# Patient Record
Sex: Female | Born: 1955 | Race: White | Hispanic: No | Marital: Single | Smoking: Former smoker
Health system: Southern US, Community
[De-identification: ages and names within clinical notes are randomized; demographics above are authoritative.]

## PROBLEM LIST (undated history)

## (undated) DIAGNOSIS — I8390 Asymptomatic varicose veins of unspecified lower extremity: Secondary | ICD-10-CM

## (undated) DIAGNOSIS — Z973 Presence of spectacles and contact lenses: Secondary | ICD-10-CM

## (undated) DIAGNOSIS — I10 Essential (primary) hypertension: Secondary | ICD-10-CM

## (undated) DIAGNOSIS — E785 Hyperlipidemia, unspecified: Secondary | ICD-10-CM

## (undated) DIAGNOSIS — R7303 Prediabetes: Secondary | ICD-10-CM

## (undated) DIAGNOSIS — Z780 Asymptomatic menopausal state: Secondary | ICD-10-CM

## (undated) DIAGNOSIS — K219 Gastro-esophageal reflux disease without esophagitis: Secondary | ICD-10-CM

## (undated) DIAGNOSIS — K5909 Other constipation: Secondary | ICD-10-CM

## (undated) HISTORY — DX: Gastro-esophageal reflux disease without esophagitis: K21.9

## (undated) HISTORY — DX: Asymptomatic menopausal state: Z78.0

## (undated) HISTORY — PX: DOBUTAMINE STRESS ECHO: SHX5426

## (undated) HISTORY — DX: Asymptomatic varicose veins of unspecified lower extremity: I83.90

## (undated) HISTORY — PX: OTHER SURGICAL HISTORY: SHX169

---

## 1981-06-24 HISTORY — PX: DILATION AND CURETTAGE OF UTERUS: SHX78

## 1998-06-28 ENCOUNTER — Other Ambulatory Visit: Admission: RE | Admit: 1998-06-28 | Discharge: 1998-06-28 | Payer: Self-pay | Admitting: Surgery

## 2000-06-06 ENCOUNTER — Other Ambulatory Visit: Admission: RE | Admit: 2000-06-06 | Discharge: 2000-06-06 | Payer: Self-pay | Admitting: Obstetrics and Gynecology

## 2000-11-20 ENCOUNTER — Emergency Department (HOSPITAL_COMMUNITY): Admission: EM | Admit: 2000-11-20 | Discharge: 2000-11-20 | Payer: Self-pay | Admitting: Emergency Medicine

## 2000-11-20 ENCOUNTER — Encounter: Payer: Self-pay | Admitting: Emergency Medicine

## 2001-07-15 ENCOUNTER — Other Ambulatory Visit: Admission: RE | Admit: 2001-07-15 | Discharge: 2001-07-15 | Payer: Self-pay | Admitting: Obstetrics and Gynecology

## 2002-07-16 ENCOUNTER — Other Ambulatory Visit: Admission: RE | Admit: 2002-07-16 | Discharge: 2002-07-16 | Payer: Self-pay | Admitting: Obstetrics and Gynecology

## 2003-07-08 ENCOUNTER — Ambulatory Visit (HOSPITAL_COMMUNITY): Admission: RE | Admit: 2003-07-08 | Discharge: 2003-07-08 | Payer: Self-pay | Admitting: Gastroenterology

## 2003-07-26 ENCOUNTER — Other Ambulatory Visit: Admission: RE | Admit: 2003-07-26 | Discharge: 2003-07-26 | Payer: Self-pay | Admitting: Obstetrics and Gynecology

## 2004-08-27 ENCOUNTER — Other Ambulatory Visit: Admission: RE | Admit: 2004-08-27 | Discharge: 2004-08-27 | Payer: Self-pay | Admitting: Obstetrics and Gynecology

## 2004-11-23 ENCOUNTER — Ambulatory Visit (HOSPITAL_COMMUNITY): Admission: RE | Admit: 2004-11-23 | Discharge: 2004-11-23 | Payer: Self-pay | Admitting: General Surgery

## 2005-08-08 ENCOUNTER — Encounter: Admission: RE | Admit: 2005-08-08 | Discharge: 2005-08-08 | Payer: Self-pay | Admitting: Family Medicine

## 2005-09-23 ENCOUNTER — Other Ambulatory Visit: Admission: RE | Admit: 2005-09-23 | Discharge: 2005-09-23 | Payer: Self-pay | Admitting: Obstetrics and Gynecology

## 2006-09-25 ENCOUNTER — Other Ambulatory Visit: Admission: RE | Admit: 2006-09-25 | Discharge: 2006-09-25 | Payer: Self-pay | Admitting: Obstetrics & Gynecology

## 2007-01-01 IMAGING — CT CT CHEST W/O CM
2 of 3 series · 16 of 36 positions shown, 20 images · IV contrast (agent unspecified)
Comparison: None.

CLINICAL DATA: Chronic cough.  Smoker.  Family history of cancer.  
 CHEST CT WITHOUT CONTRAST:
TECHNIQUE: Multidetector CT imaging of the chest was performed following the standard protocol without IV contrast.

[Series 2: routine chest · axial · 0.70mm/px · z∈[-365,-75]mm · 13 of 66 slices shown, 17 images]
[im 5/66  mediastinal]
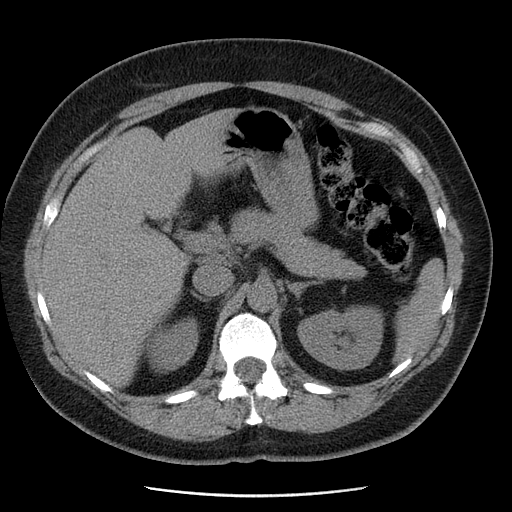
[im 5/66  lung]
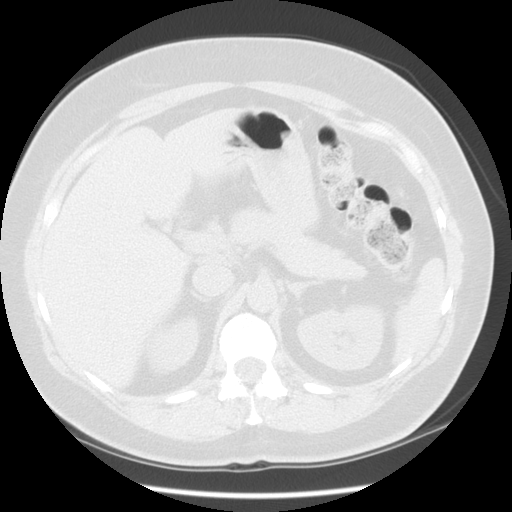
[im 10/66  lung]
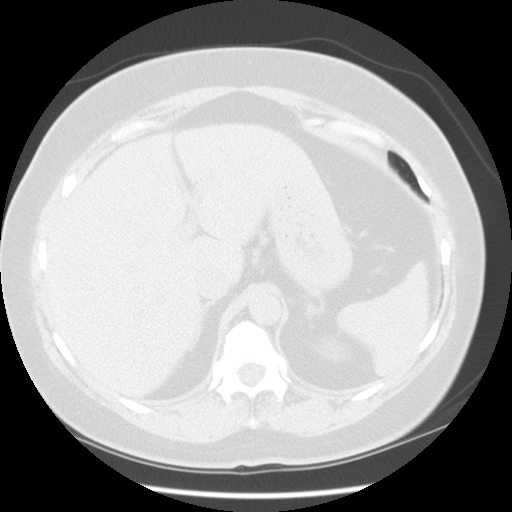
[im 15/66  lung]
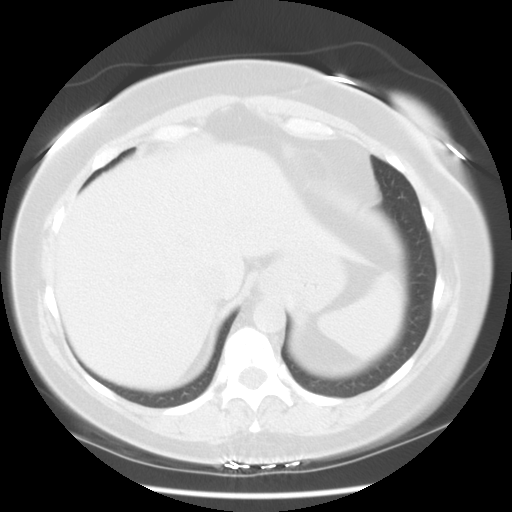
[im 20/66  lung]
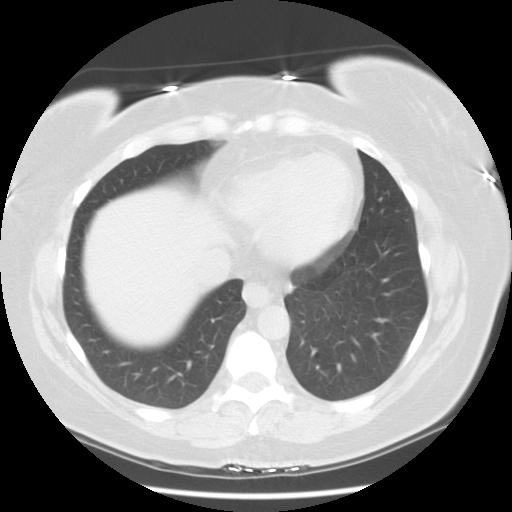
[im 25/66  mediastinal]
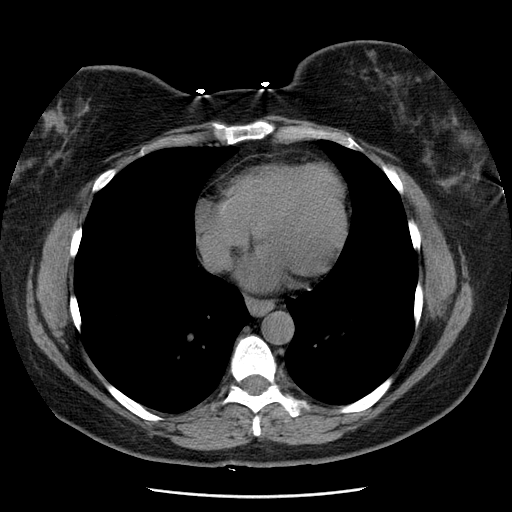
[im 25/66  lung]
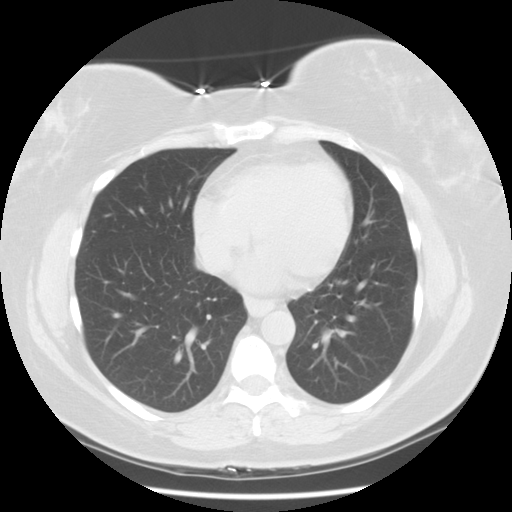
[im 29/66  lung]
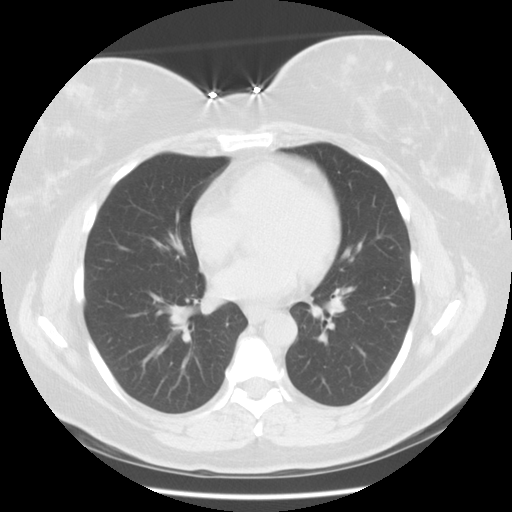
[im 34/66  lung]
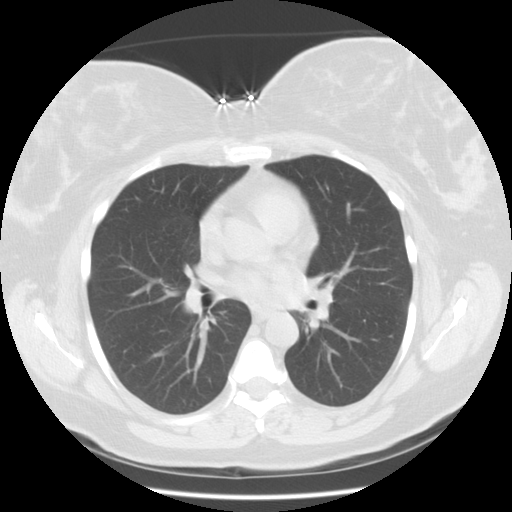
[im 39/66  lung]
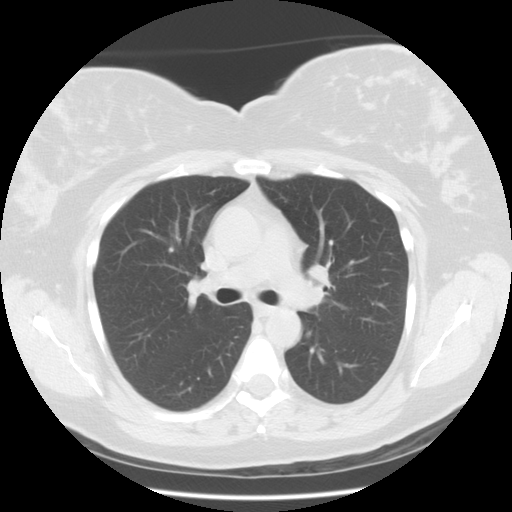
[im 44/66  mediastinal]
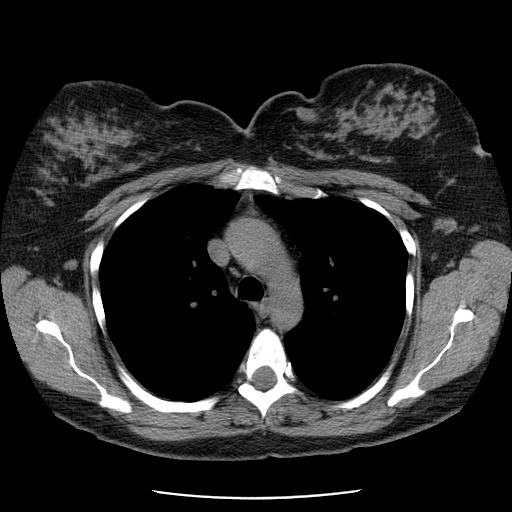
[im 44/66  lung]
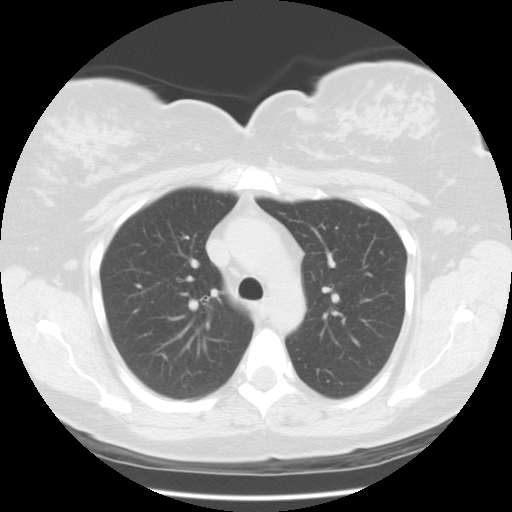
[im 49/66  lung]
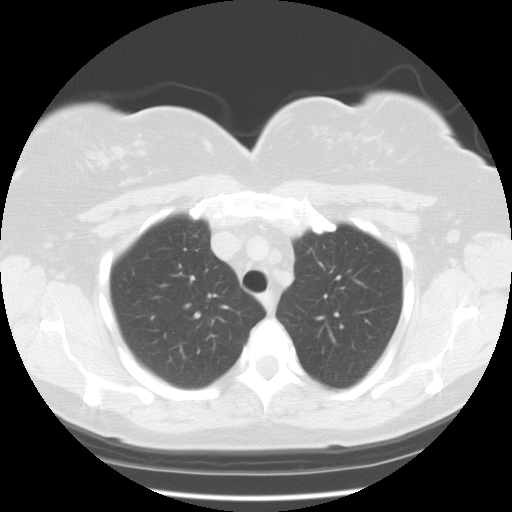
[im 53/66  lung]
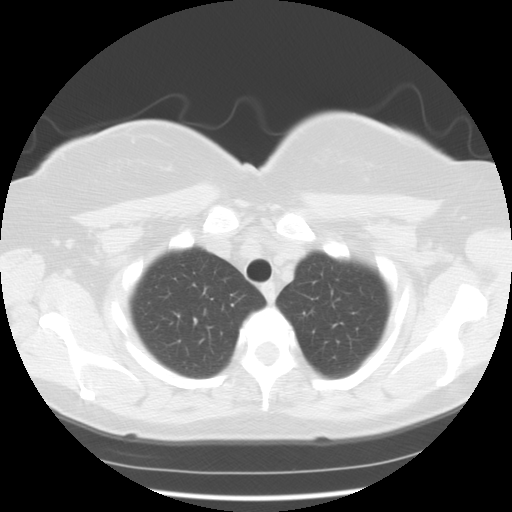
[im 58/66  lung]
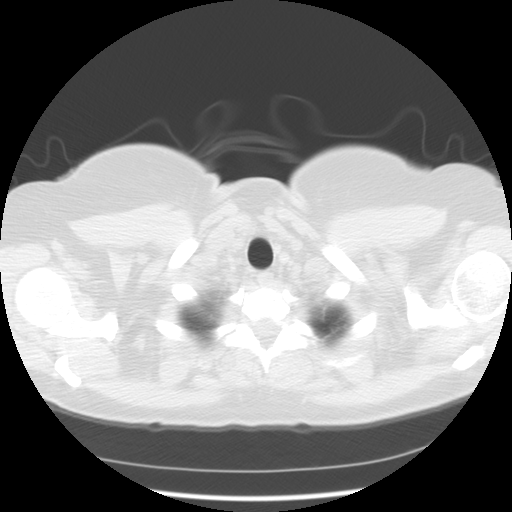
[im 63/66  mediastinal]
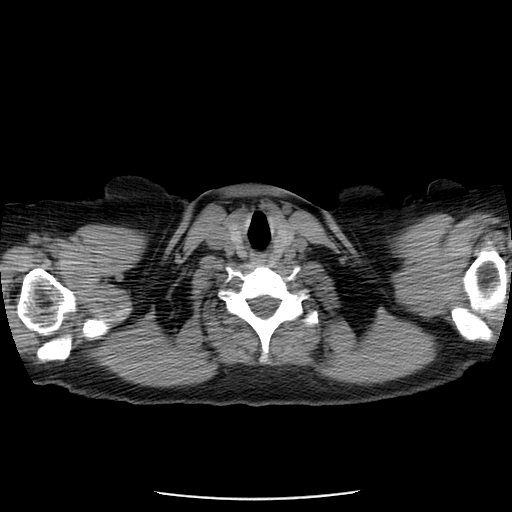
[im 63/66  lung]
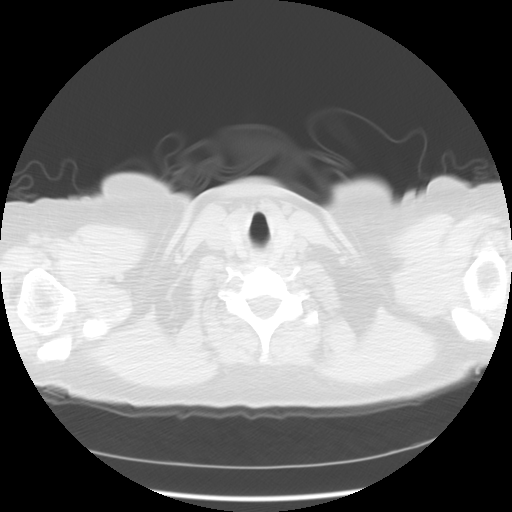

[Series 401: reformatted · coronal · 0.70mm/px · 3 of 98 slices shown]
[im 20/98  lung]
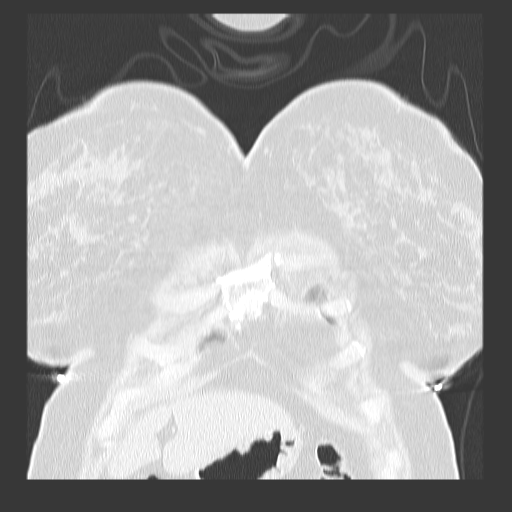
[im 39/98  lung]
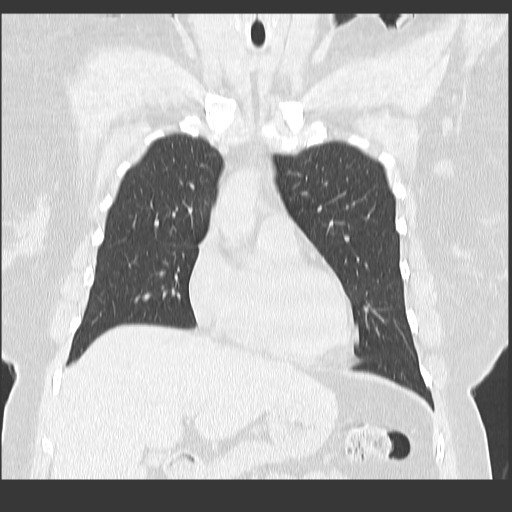
[im 59/98  lung]
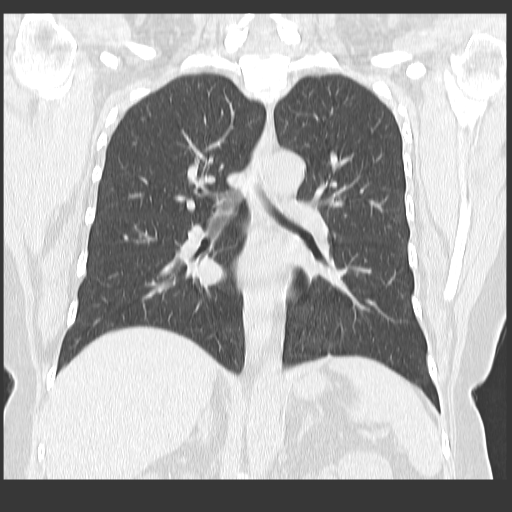

[16 of 36 positions shown; findings below may reference images not displayed]

FINDINGS: The heart and great vessels are within normal limits.   There are no pleural or pericardial effusions identified.  No enlarged lymph nodes are noted.  The lungs are clear.  No pulmonary nodules, masses, or focal airspace disease identified.  Visualized upper abdomen is unremarkable.
IMPRESSION: Unremarkable CT of the chest without contrast.

## 2007-04-10 ENCOUNTER — Encounter: Admission: RE | Admit: 2007-04-10 | Discharge: 2007-04-10 | Payer: Self-pay | Admitting: Family Medicine

## 2007-11-11 ENCOUNTER — Other Ambulatory Visit: Admission: RE | Admit: 2007-11-11 | Discharge: 2007-11-11 | Payer: Self-pay | Admitting: Obstetrics & Gynecology

## 2007-11-12 ENCOUNTER — Encounter: Admission: RE | Admit: 2007-11-12 | Discharge: 2007-11-12 | Payer: Self-pay | Admitting: Family Medicine

## 2008-02-22 IMAGING — US US-BREAST([ID])
1 series · 13 of 25 positions shown · non-contrast
Comparison: NONE

CLINICAL DATA: Follow-up mammogram. 

BILATERAL BREAST ULTRASOUND

[Series 1: us breast · 0.10mm/px · 13 of 60 slices shown]
[im 1/60]
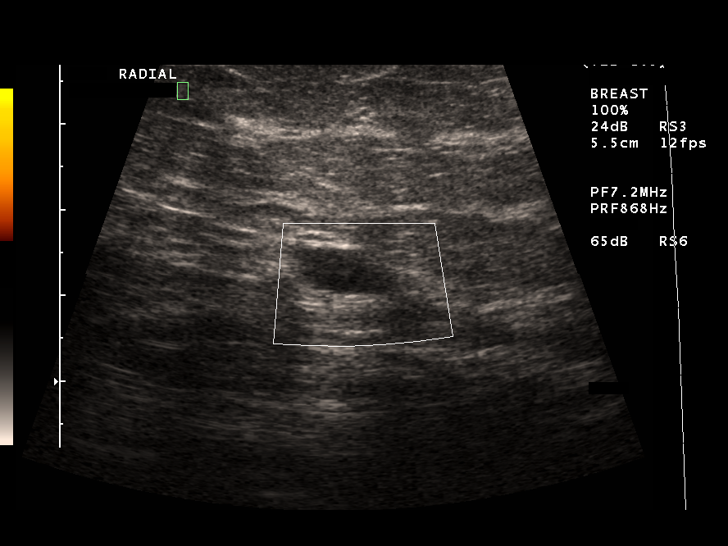
[im 5/60]
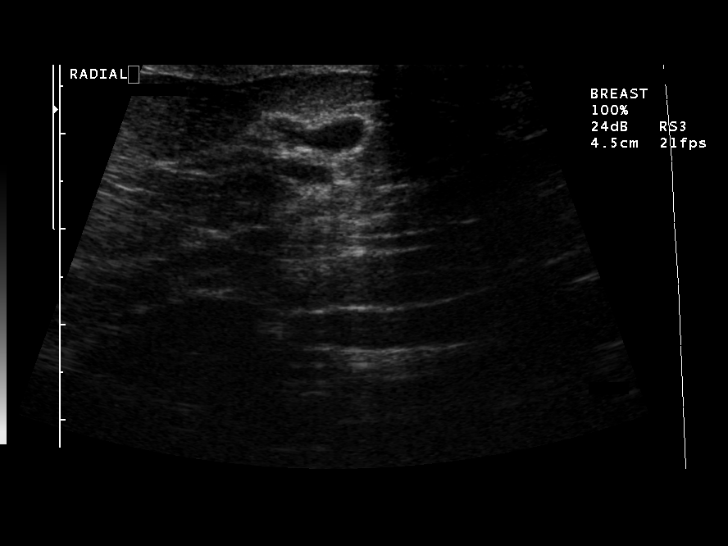
[im 10/60]
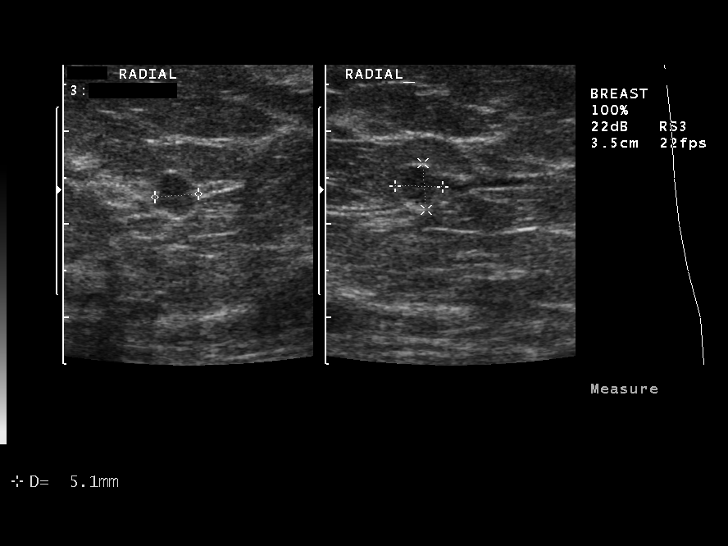
[im 15/60]
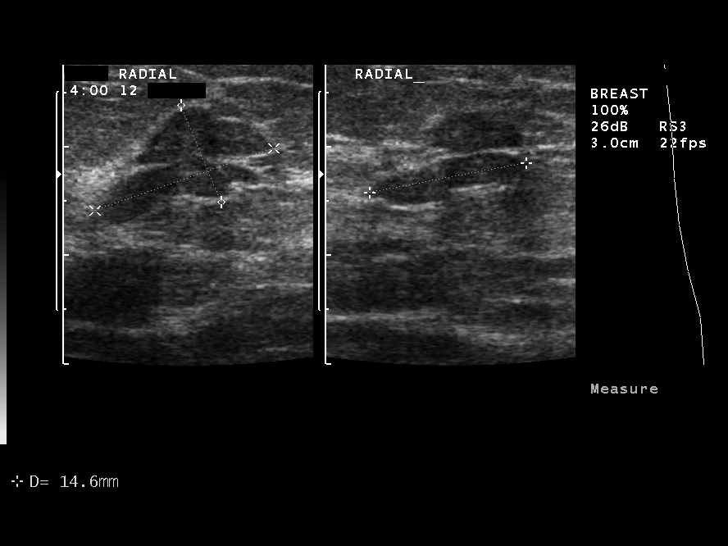
[im 20/60]
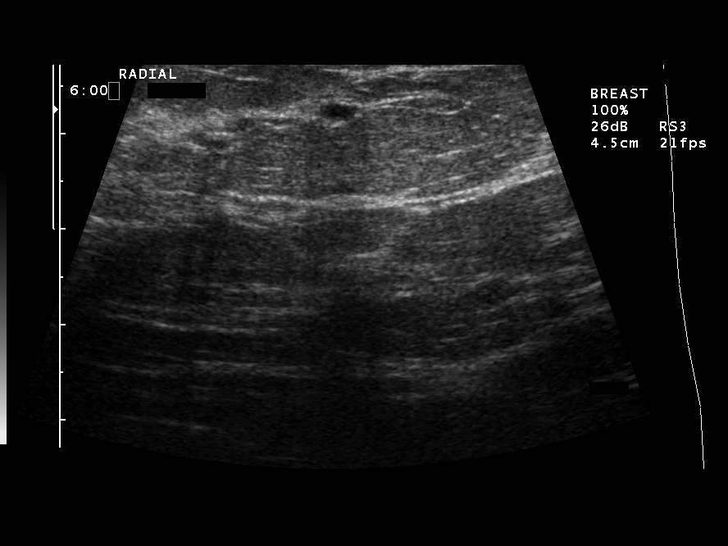
[im 25/60]
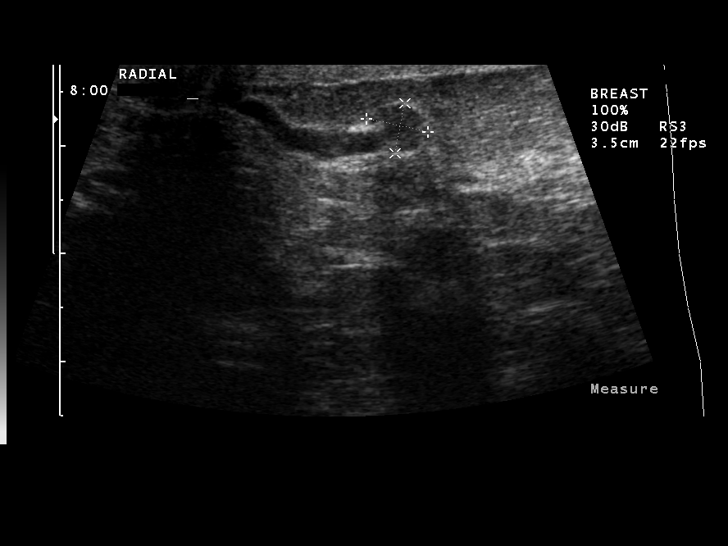
[im 30/60]
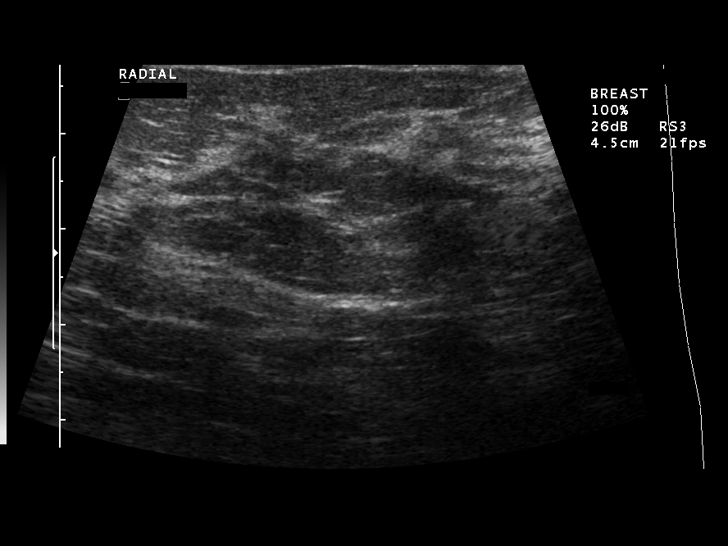
[im 35/60]
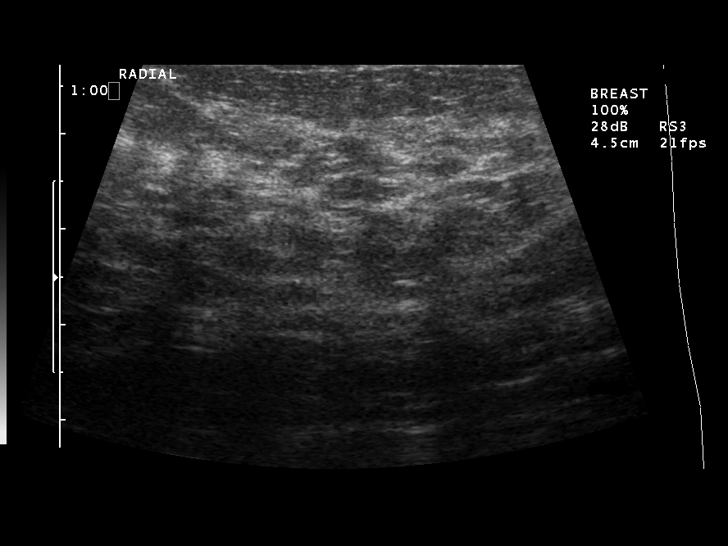
[im 40/60]
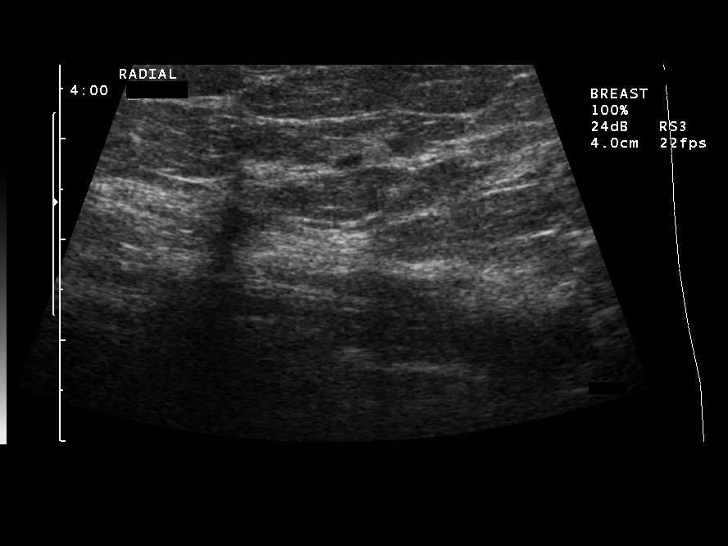
[im 45/60]
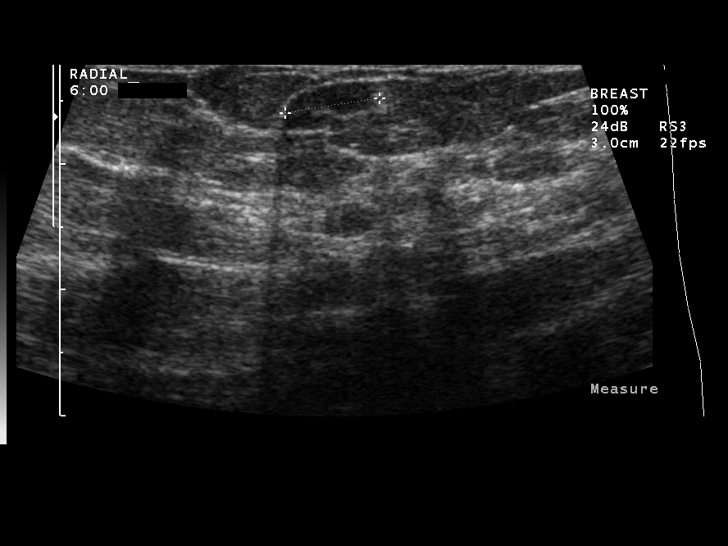
[im 50/60]
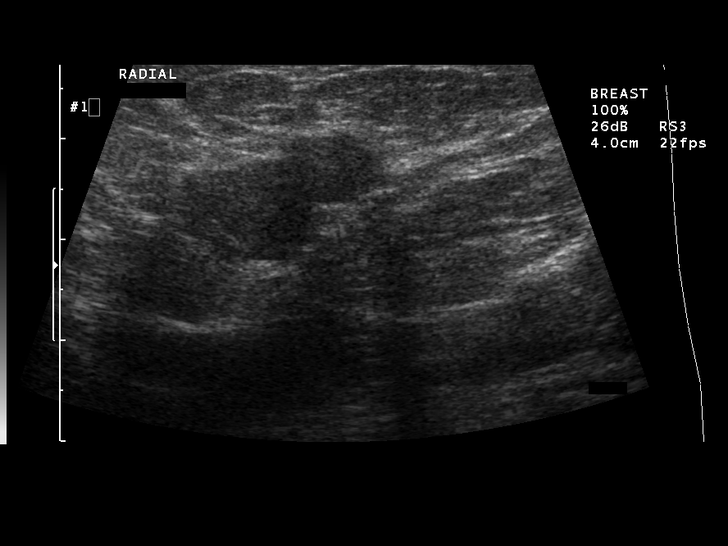
[im 55/60]
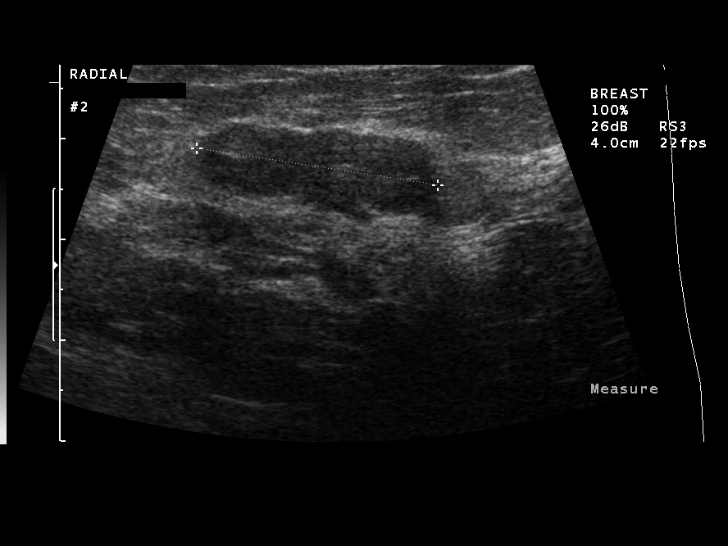
[im 60/60]
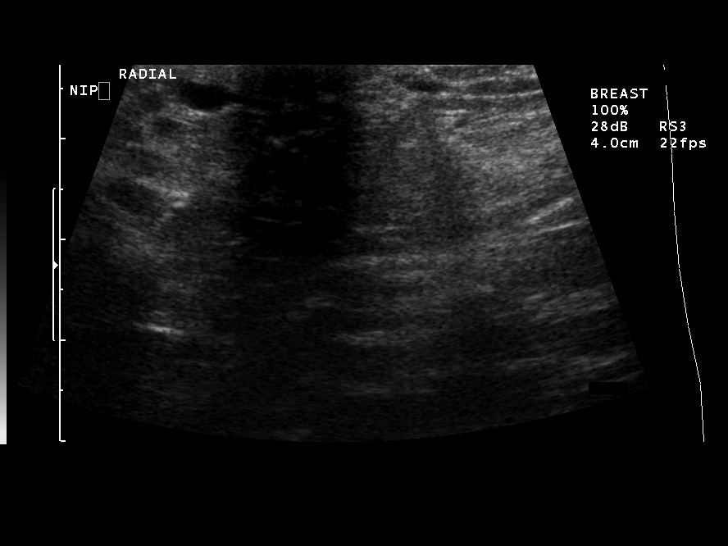

[13 of 25 positions shown; findings below may reference images not displayed]

FINDINGS: The patient had a recent mammogram dated 09/16/2006.  
In the right breast there is a hypoechoic mass with well defined 
margin.  This is an oval shaped mass parallel to the skin surface. 
 This is located at the [DATE] position, 15 cm from the nipple.  
There is also a hypoechoic oval shaped well circumscribed mass 
with undulating margins at the [DATE] position, 15 cm from the 
nipple.  There are two of these masses seen.  These are adjacent 
to each other.  There is a hypoechoic area seen at the [DATE] 
position, 6 cm from the margin.  The overall size of this mass is 
approximately 13 x 4 x 6 mm.  There is a cyst seen at the [DATE] 
position.  There are also ectatic ducts seen in the [DATE] position. 
In the left breast there are two hypoechoic masses seen with a 
well circumscribed slightly undulating margin.  These are oval in 
shape and parallel to the skin surface located at the [DATE] 
position, 12 cm from the nipple.  There is a cyst seen in the 
retroareolar region that measures 6 x 11 x 6 mm.  There are 
hypoechoic masses seen at the [DATE] position, 12 cm from the 
nipple.  There is also a hypoechoic mass seen at the [DATE] 
position, 8 cm from the nipple that measures 11 x 5 x 4 mm.  
Hypoechoic mass seen in the [DATE] position, 7 cm from the nipple 
that measures 7 x 6 x 5 mm.
IMPRESSION: Multiple hypoechoic solid appearing masses seen in 
both breasts.  These have a benign ultrasonographic appearance.  I 
recommend a six-month interval follow-up ultrasound to ensure 
stability of these lesions.  There are also bilateral cysts seen. 
I would recommend a six-month interval follow-up mammogram to 
reassess the breast masses. BI-RADS 3 - Probably Benign Osiris Martha 
09/29/2006  Tran Date: 09/30/2006 STAZO LALETSANG

## 2008-06-08 ENCOUNTER — Encounter: Admission: RE | Admit: 2008-06-08 | Discharge: 2008-06-08 | Payer: Self-pay | Admitting: Obstetrics and Gynecology

## 2008-08-26 IMAGING — US MAMMO-BILAT-US
1 series · 14 of 16 positions shown · non-contrast
Comparison: NONE

CLINICAL DATA: Osnar Samaniego.(SARETTA)(IWONA MALINOWSKA)   Diagnostic Mammogram. 

BILATERAL MAMMOGRAM AND BILATERAL BREAST ULTRASOUND

[Series 1: us b/left breast · 0.07mm/px · 14 of 43 slices shown]
[im 1/43]
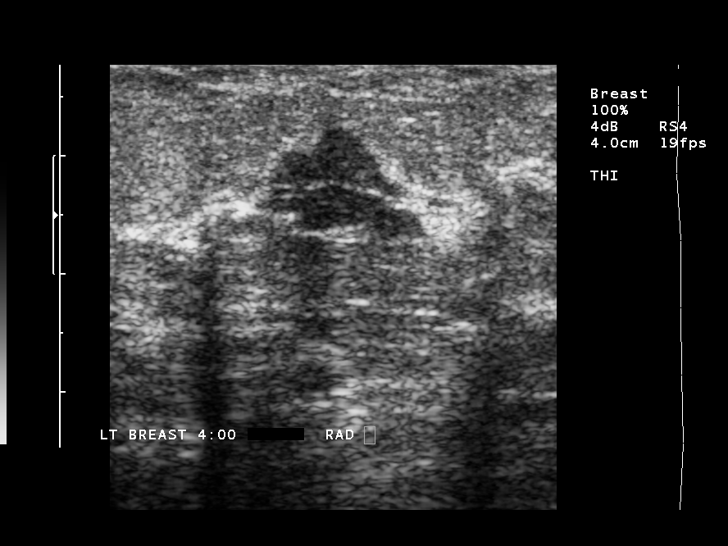
[im 3/43]
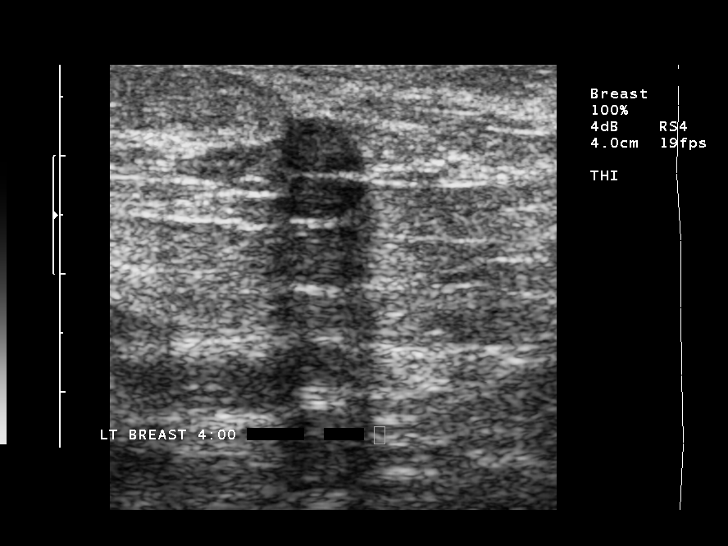
[im 6/43]
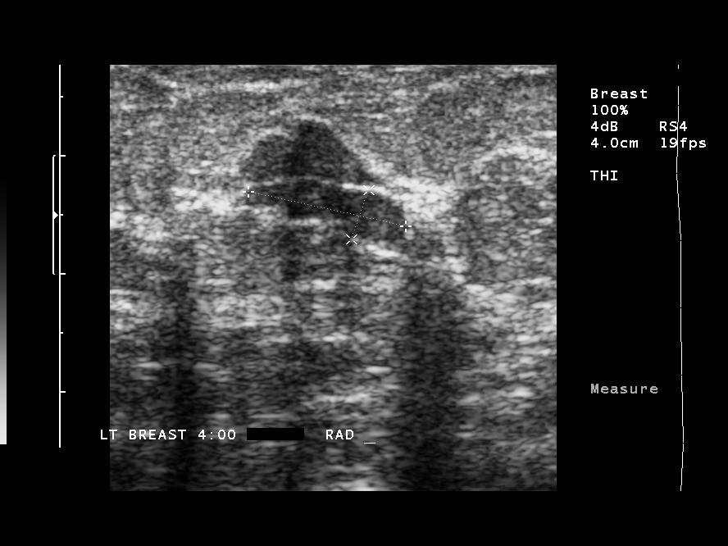
[im 12/43]
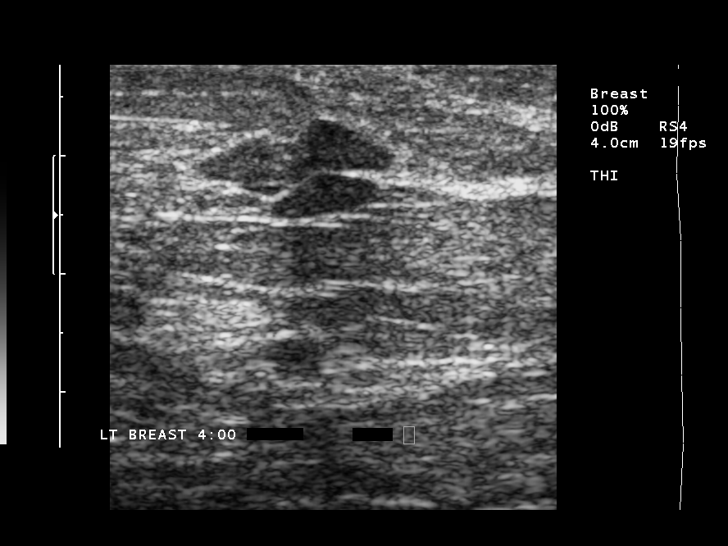
[im 15/43]
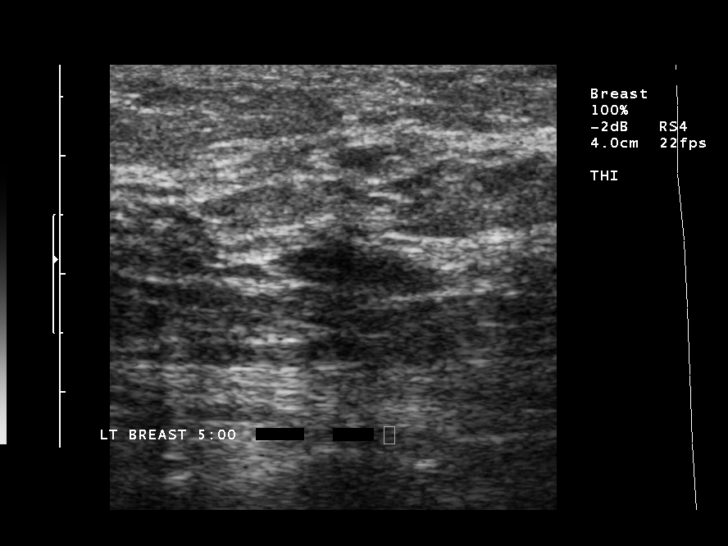
[im 17/43]
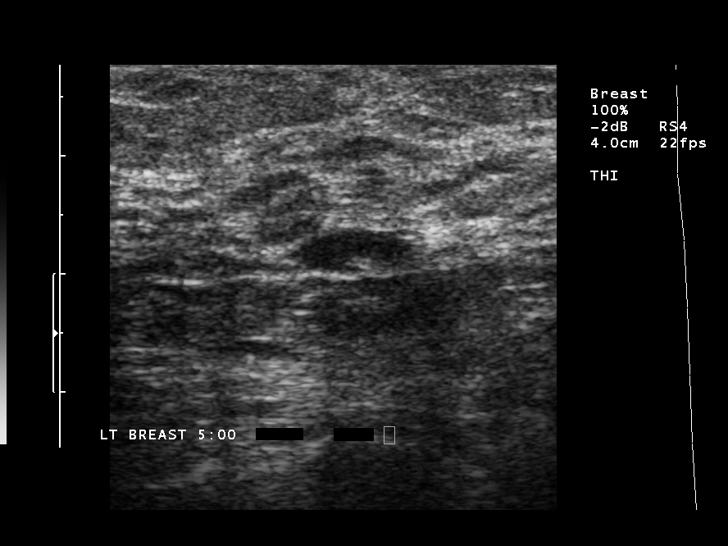
[im 20/43]
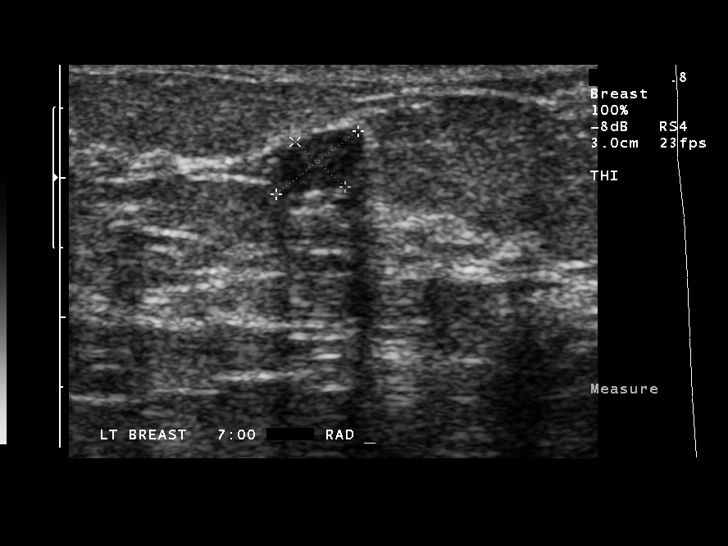
[im 23/43]
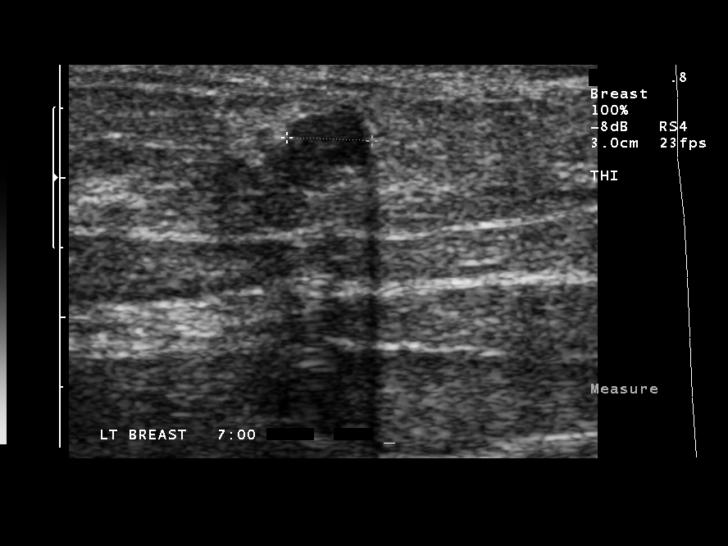
[im 26/43]
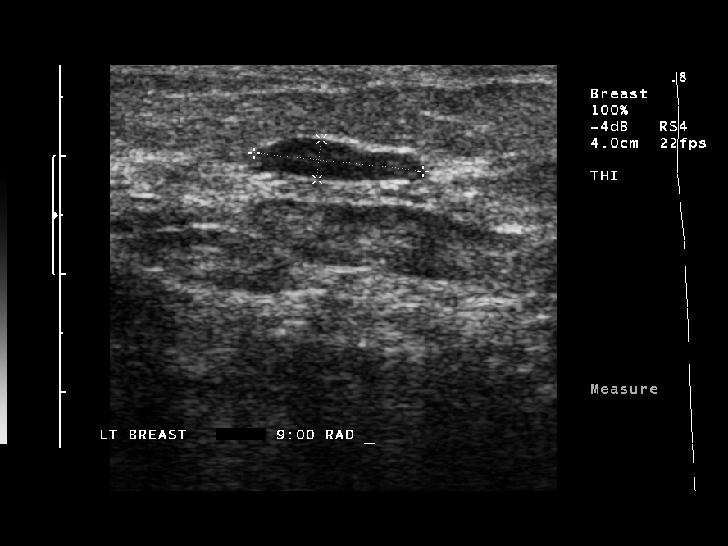
[im 29/43]
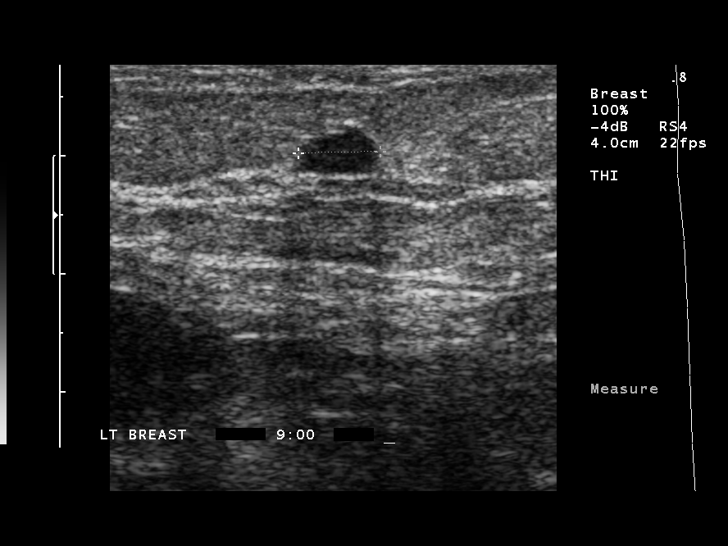
[im 34/43]
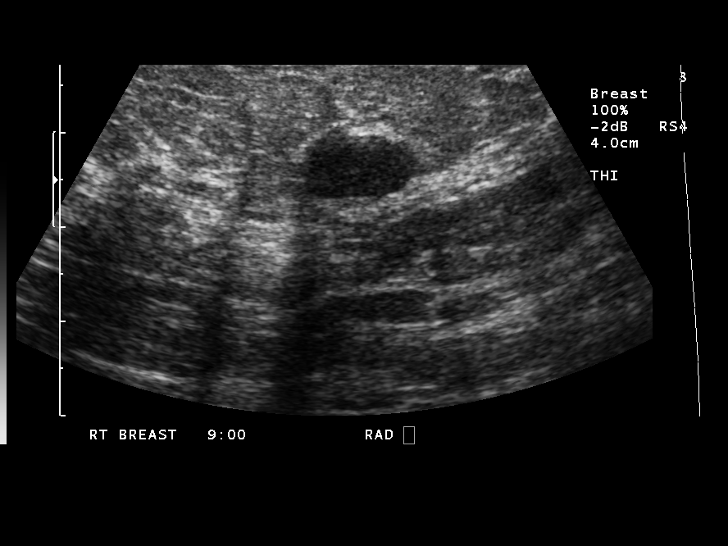
[im 37/43]
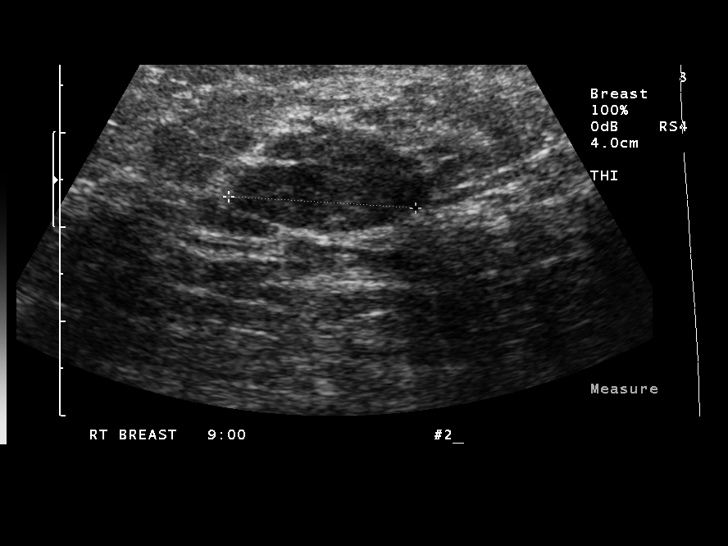
[im 40/43]
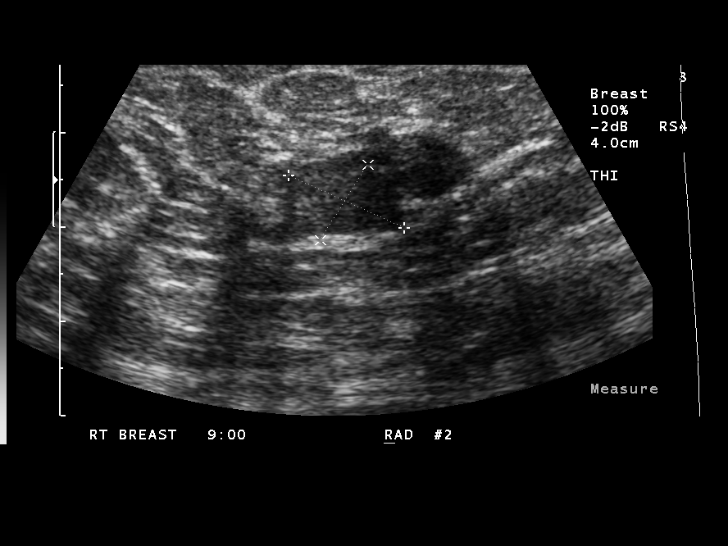
[im 43/43]
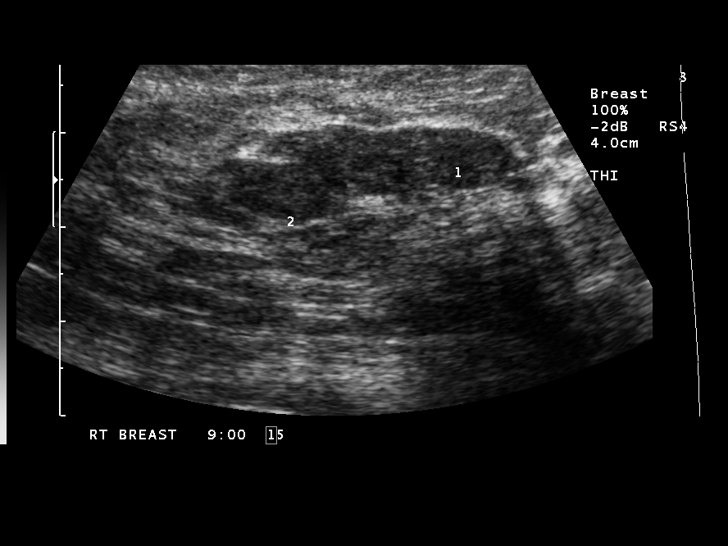

[14 of 16 positions shown; findings below may reference images not displayed]

FINDINGS: [facility]-view routine mammography was performed and 
compared with 09-19-06 and 07-14-02 studies.  Moderately dense 
nodular glandular pattern is again noted.
IMPRESSION: Camp Watanabe, M.D. electronically reviewed on 
04/06/2007 Dict Date: 04/03/2007  Tran Date: 04/06/2007 CAV  TRUONG

## 2008-11-01 ENCOUNTER — Other Ambulatory Visit: Admission: RE | Admit: 2008-11-01 | Discharge: 2008-11-01 | Payer: Self-pay | Admitting: Obstetrics and Gynecology

## 2008-11-16 ENCOUNTER — Encounter: Admission: RE | Admit: 2008-11-16 | Discharge: 2008-11-16 | Payer: Self-pay | Admitting: Family Medicine

## 2009-06-09 ENCOUNTER — Encounter: Admission: RE | Admit: 2009-06-09 | Discharge: 2009-06-09 | Payer: Self-pay | Admitting: Obstetrics and Gynecology

## 2009-10-24 ENCOUNTER — Other Ambulatory Visit: Admission: RE | Admit: 2009-10-24 | Discharge: 2009-10-24 | Payer: Self-pay | Admitting: Family Medicine

## 2009-12-12 ENCOUNTER — Encounter: Admission: RE | Admit: 2009-12-12 | Discharge: 2009-12-12 | Payer: Self-pay | Admitting: Family Medicine

## 2010-08-23 DIAGNOSIS — Z780 Asymptomatic menopausal state: Secondary | ICD-10-CM

## 2010-08-23 HISTORY — DX: Asymptomatic menopausal state: Z78.0

## 2010-11-06 ENCOUNTER — Other Ambulatory Visit: Payer: Self-pay | Admitting: Family Medicine

## 2010-11-06 DIAGNOSIS — Z1231 Encounter for screening mammogram for malignant neoplasm of breast: Secondary | ICD-10-CM

## 2010-11-09 NOTE — Op Note (Signed)
NAME:  Jamie Vaughn, Jamie Vaughn                           ACCOUNT NO.:  [id number]   MEDICAL RECORD NO.:  [id number]                   PATIENT TYPE:  AMB   LOCATION:  ENDO                                 FACILITY:  [facility]   PHYSICIAN:  James L. Malon Kindle., M.D.          DATE OF BIRTH:  1956-02-21   DATE OF PROCEDURE:  07/08/2003  DATE OF DISCHARGE:                                 OPERATIVE REPORT   PROCEDURE:  Colonoscopy.   MEDICATIONS:  Fentanyl 100 mcg and Versed 10 mg IV.   SCOPE:  Olympus pediatric adjustable colonoscope.   INDICATION:  Hemepositive stool (792.1).   DESCRIPTION OF PROCEDURE:  The procedure had been explained to the patient  and consent obtained.  With the patient in the left lateral decubitus  position, digital exam was performed.  The scope was inserted and advanced.  The pediatric adjustable scope was used.  The prep was excellent.  The  patient had Vaughn very long, tortuous colon.  Using abdominal pressure and  position changes, we were eventually able to reach the cecum.  The ileocecal  valve and appendiceal orifice were identified.  The scope was withdrawn in  the cecum.  Ascending colon, transverse colon, descending and sigmoid colon  were seen well upon withdrawal.  No polyps or other lesions were seen.  There was no significant diverticulitis.  The scope was withdrawn down to  the rectum.  The rectum was free of polyps.  Internal hemorrhoids were seen  in the anal canal upon removal of the scope.  The scope was withdrawn.  The  patient tolerated the procedure well and was maintained on low-flow oxygen  and Vaughn pulse oximeter throughout the procedure.   ASSESSMENT:  Hemepositive stool (792.1).  Colonoscopy negative other than  internal hemorrhoids.   PLAN:  Give Vaughn hemorrhoid instruction sheet and see back in the office in  three months.                                               James L. Malon Kindle., M.D.    Waldron Session  D:  07/08/2003  T:  07/08/2003  Job:   [id number]   cc:   Chales Salmon. Abigail Miyamoto, M.D.  [address]  [place]  [place] [address]  Fax: [phone]

## 2010-11-09 NOTE — Op Note (Signed)
Jamie Vaughn, Jamie Vaughn                 ACCOUNT NO.:  [id number]   MEDICAL RECORD NO.:  [id number]          PATIENT TYPE:  AMB   LOCATION:  DAY                          FACILITY:  [facility]   PHYSICIAN:  Ollen Gross. Vernell Morgans, M.D. DATE OF BIRTH:  1956-05-21   DATE OF PROCEDURE:  11/23/2004  DATE OF DISCHARGE:                                 OPERATIVE REPORT   PREOPERATIVE DIAGNOSIS:  Internal hemorrhoids.   POSTOPERATIVE DIAGNOSIS:  Internal hemorrhoids.   PROCEDURE:  Exam under anesthesia and banding of three internal hemorrhoids.   SURGEON:  Dr. Carolynne Edouard   ANESTHESIA:  General via LMA.   PROCEDURE:  After informed consent was obtained, the patient was brought to  the operating room and placed in a supine position on the operating room  table.  After adequate induction of general anesthesia, the patient was  placed in lithotomy position, and her perirectal area was prepped with  Betadine and draped in the usual sterile manner.  A bullet-type retractor  was used to inspect the rectum.  There were three more prominent areas of  internal hemorrhoidal tissue.  A banding device was placed inside the  rectum.  Each of these internal hemorrhoid tufts were grasped with the  grabber, and the band was deployed at the base of each of these internal  hemorrhoidal tufts.  Care was taken to keep these bands proximal to the  dentate line.  She had 1-2 small tears in the skin which were bleeding at  the end of the case and were controlled with electrocautery.  The entire  perirectal area was then infiltrated 0.25% Marcaine with epinephrine and 1  mL of Wydase.  Lidocaine jelly was then inserted into the rectum with a  small piece of Gelfoam, and sterile dressings were applied.  The patient  tolerated well.  At the end of the case, all needle, sponge, and instrument  counts were correct.  The patient was then awakened and taken to recovery in  stable condition.       PST/MEDQ  D:  11/23/2004  T:   11/23/2004  Job:  [id number]

## 2010-12-14 ENCOUNTER — Ambulatory Visit
Admission: RE | Admit: 2010-12-14 | Discharge: 2010-12-14 | Disposition: A | Discharge status: Home / Self Care | Payer: No Typology Code available for payment source | Source: Ambulatory Visit | Attending: Family Medicine | Admitting: Family Medicine

## 2010-12-14 DIAGNOSIS — Z1231 Encounter for screening mammogram for malignant neoplasm of breast: Secondary | ICD-10-CM

## 2011-11-05 ENCOUNTER — Other Ambulatory Visit: Payer: Self-pay | Admitting: Family Medicine

## 2011-11-05 DIAGNOSIS — Z1231 Encounter for screening mammogram for malignant neoplasm of breast: Secondary | ICD-10-CM

## 2011-12-10 ENCOUNTER — Ambulatory Visit: Payer: BC Managed Care – PPO | Attending: Family Medicine | Admitting: Physical Therapy

## 2011-12-10 DIAGNOSIS — M25669 Stiffness of unspecified knee, not elsewhere classified: Secondary | ICD-10-CM | POA: Insufficient documentation

## 2011-12-10 DIAGNOSIS — IMO0001 Reserved for inherently not codable concepts without codable children: Secondary | ICD-10-CM | POA: Insufficient documentation

## 2011-12-10 DIAGNOSIS — M25569 Pain in unspecified knee: Secondary | ICD-10-CM | POA: Insufficient documentation

## 2011-12-17 ENCOUNTER — Ambulatory Visit
Admission: RE | Admit: 2011-12-17 | Discharge: 2011-12-17 | Disposition: A | Discharge status: Home / Self Care | Payer: BC Managed Care – PPO | Source: Ambulatory Visit | Attending: Family Medicine | Admitting: Family Medicine

## 2011-12-17 DIAGNOSIS — Z1231 Encounter for screening mammogram for malignant neoplasm of breast: Secondary | ICD-10-CM

## 2011-12-18 ENCOUNTER — Ambulatory Visit: Payer: BC Managed Care – PPO

## 2011-12-23 ENCOUNTER — Ambulatory Visit: Payer: BC Managed Care – PPO | Attending: Family Medicine | Admitting: Physical Therapy

## 2011-12-23 DIAGNOSIS — M25569 Pain in unspecified knee: Secondary | ICD-10-CM | POA: Insufficient documentation

## 2011-12-23 DIAGNOSIS — M25669 Stiffness of unspecified knee, not elsewhere classified: Secondary | ICD-10-CM | POA: Insufficient documentation

## 2011-12-23 DIAGNOSIS — IMO0001 Reserved for inherently not codable concepts without codable children: Secondary | ICD-10-CM | POA: Insufficient documentation

## 2011-12-25 ENCOUNTER — Ambulatory Visit: Payer: BC Managed Care – PPO | Admitting: Physical Therapy

## 2012-01-02 ENCOUNTER — Encounter: Payer: BC Managed Care – PPO | Admitting: Physical Therapy

## 2012-01-06 ENCOUNTER — Ambulatory Visit: Payer: BC Managed Care – PPO

## 2012-01-09 ENCOUNTER — Encounter: Payer: No Typology Code available for payment source | Admitting: Physical Therapy

## 2012-01-13 ENCOUNTER — Encounter: Payer: No Typology Code available for payment source | Admitting: Physical Therapy

## 2012-01-16 ENCOUNTER — Encounter: Payer: No Typology Code available for payment source | Admitting: Physical Therapy

## 2012-01-21 ENCOUNTER — Encounter: Payer: BC Managed Care – PPO | Admitting: Physical Therapy

## 2012-11-13 ENCOUNTER — Other Ambulatory Visit: Payer: Self-pay

## 2012-11-13 DIAGNOSIS — Z1231 Encounter for screening mammogram for malignant neoplasm of breast: Secondary | ICD-10-CM

## 2012-12-17 ENCOUNTER — Ambulatory Visit
Admission: RE | Admit: 2012-12-17 | Discharge: 2012-12-17 | Disposition: A | Discharge status: Home / Self Care | Payer: BC Managed Care – PPO | Source: Ambulatory Visit

## 2012-12-17 DIAGNOSIS — Z1231 Encounter for screening mammogram for malignant neoplasm of breast: Secondary | ICD-10-CM

## 2013-11-09 ENCOUNTER — Other Ambulatory Visit (HOSPITAL_COMMUNITY)
Admission: RE | Admit: 2013-11-09 | Discharge: 2013-11-09 | Disposition: A | Discharge status: Home / Self Care | Payer: BC Managed Care – PPO | Source: Ambulatory Visit | Attending: Family Medicine | Admitting: Family Medicine

## 2013-11-09 ENCOUNTER — Other Ambulatory Visit: Payer: Self-pay | Admitting: Family Medicine

## 2013-11-09 ENCOUNTER — Other Ambulatory Visit: Payer: Self-pay

## 2013-11-09 DIAGNOSIS — Z1151 Encounter for screening for human papillomavirus (HPV): Secondary | ICD-10-CM | POA: Insufficient documentation

## 2013-11-09 DIAGNOSIS — Z0000 Encounter for general adult medical examination without abnormal findings: Secondary | ICD-10-CM | POA: Insufficient documentation

## 2013-11-09 DIAGNOSIS — Z1231 Encounter for screening mammogram for malignant neoplasm of breast: Secondary | ICD-10-CM

## 2013-11-10 ENCOUNTER — Other Ambulatory Visit: Payer: Self-pay | Admitting: Family Medicine

## 2013-11-10 DIAGNOSIS — R7989 Other specified abnormal findings of blood chemistry: Secondary | ICD-10-CM

## 2013-11-10 DIAGNOSIS — R945 Abnormal results of liver function studies: Principal | ICD-10-CM

## 2013-11-12 ENCOUNTER — Other Ambulatory Visit: Payer: BC Managed Care – PPO

## 2013-11-12 ENCOUNTER — Ambulatory Visit
Admission: RE | Admit: 2013-11-12 | Discharge: 2013-11-12 | Disposition: A | Discharge status: Home / Self Care | Payer: BC Managed Care – PPO | Source: Ambulatory Visit | Attending: Family Medicine | Admitting: Family Medicine

## 2013-11-12 DIAGNOSIS — R7989 Other specified abnormal findings of blood chemistry: Secondary | ICD-10-CM

## 2013-11-12 DIAGNOSIS — R945 Abnormal results of liver function studies: Principal | ICD-10-CM

## 2013-12-20 ENCOUNTER — Ambulatory Visit
Admission: RE | Admit: 2013-12-20 | Discharge: 2013-12-20 | Disposition: A | Discharge status: Home / Self Care | Payer: BC Managed Care – PPO | Source: Ambulatory Visit

## 2013-12-20 ENCOUNTER — Encounter (INDEPENDENT_AMBULATORY_CARE_PROVIDER_SITE_OTHER): Payer: Self-pay

## 2013-12-20 DIAGNOSIS — Z1231 Encounter for screening mammogram for malignant neoplasm of breast: Secondary | ICD-10-CM

## 2014-11-11 ENCOUNTER — Other Ambulatory Visit: Payer: Self-pay

## 2014-11-14 ENCOUNTER — Other Ambulatory Visit: Payer: Self-pay | Admitting: Family Medicine

## 2014-11-14 DIAGNOSIS — Z1231 Encounter for screening mammogram for malignant neoplasm of breast: Secondary | ICD-10-CM

## 2014-11-14 DIAGNOSIS — N6002 Solitary cyst of left breast: Secondary | ICD-10-CM

## 2014-11-16 ENCOUNTER — Other Ambulatory Visit: Payer: Self-pay | Admitting: Family Medicine

## 2014-11-16 DIAGNOSIS — N6002 Solitary cyst of left breast: Secondary | ICD-10-CM

## 2014-11-16 DIAGNOSIS — N644 Mastodynia: Secondary | ICD-10-CM

## 2014-11-22 ENCOUNTER — Ambulatory Visit
Admission: RE | Admit: 2014-11-22 | Discharge: 2014-11-22 | Disposition: A | Discharge status: Home / Self Care | Payer: BLUE CROSS/BLUE SHIELD | Source: Ambulatory Visit | Attending: Family Medicine | Admitting: Family Medicine

## 2014-11-22 DIAGNOSIS — N6002 Solitary cyst of left breast: Secondary | ICD-10-CM

## 2014-11-22 DIAGNOSIS — N644 Mastodynia: Secondary | ICD-10-CM

## 2014-11-24 ENCOUNTER — Other Ambulatory Visit: Payer: Self-pay

## 2014-11-24 DIAGNOSIS — Z1231 Encounter for screening mammogram for malignant neoplasm of breast: Secondary | ICD-10-CM

## 2014-12-21 ENCOUNTER — Ambulatory Visit
Admission: RE | Admit: 2014-12-21 | Discharge: 2014-12-21 | Disposition: A | Discharge status: Home / Self Care | Payer: BLUE CROSS/BLUE SHIELD | Source: Ambulatory Visit

## 2014-12-21 DIAGNOSIS — Z1231 Encounter for screening mammogram for malignant neoplasm of breast: Secondary | ICD-10-CM

## 2015-01-03 ENCOUNTER — Encounter: Payer: Self-pay | Admitting: Certified Nurse Midwife

## 2015-02-14 ENCOUNTER — Other Ambulatory Visit: Payer: Self-pay | Admitting: Gastroenterology

## 2015-04-07 IMAGING — US US ABDOMEN COMPLETE
1 series · 13 of 25 positions shown · non-contrast
Comparison: None.

CLINICAL DATA: Elevated liver function tests

EXAM:
ULTRASOUND ABDOMEN COMPLETE

[Series 1: us abdomen complete · 0.30mm/px · 13 of 74 slices shown]
[im 1/74]
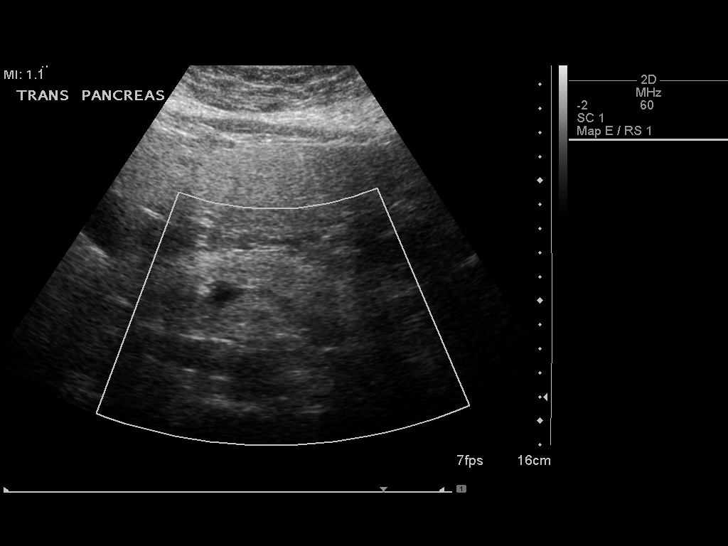
[im 7/74]
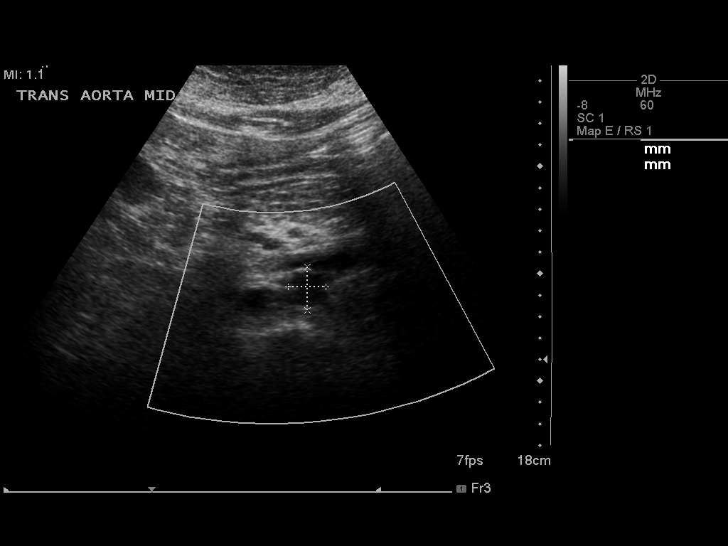
[im 13/74]
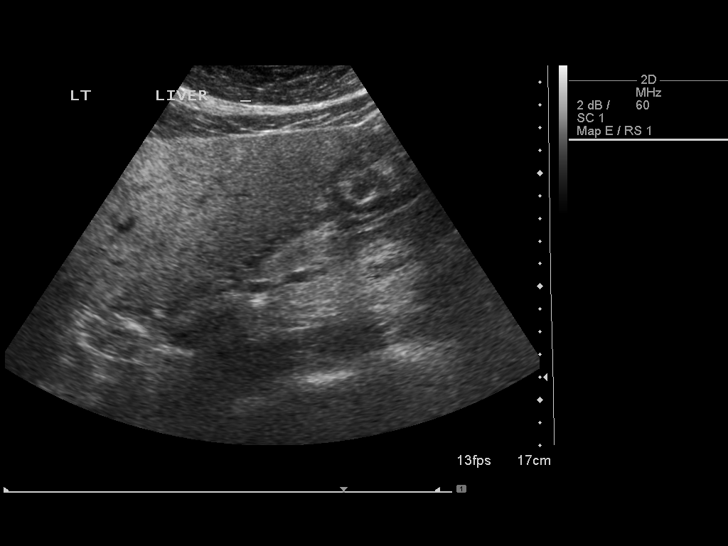
[im 19/74]
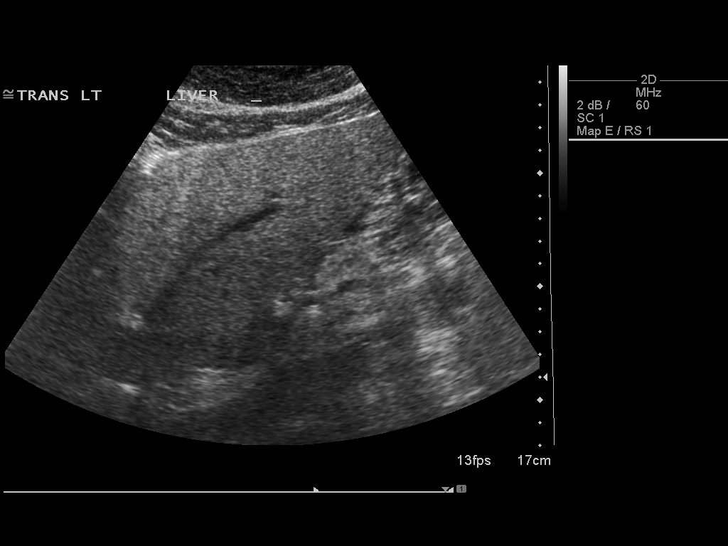
[im 25/74]
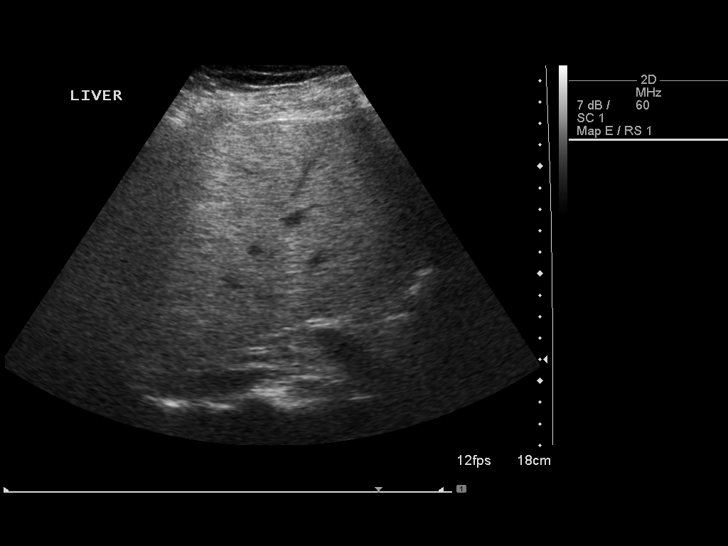
[im 31/74]
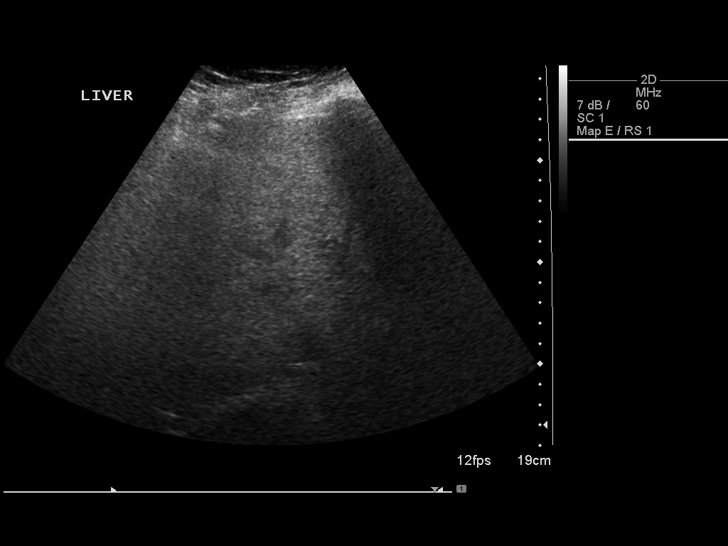
[im 37/74]
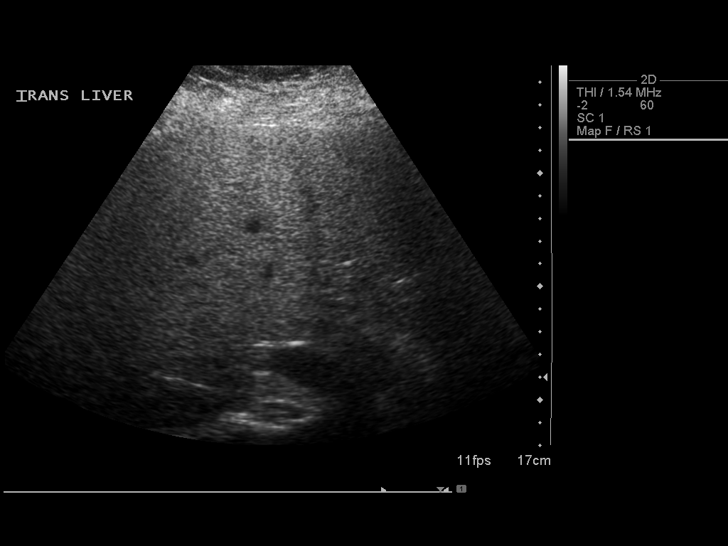
[im 43/74]
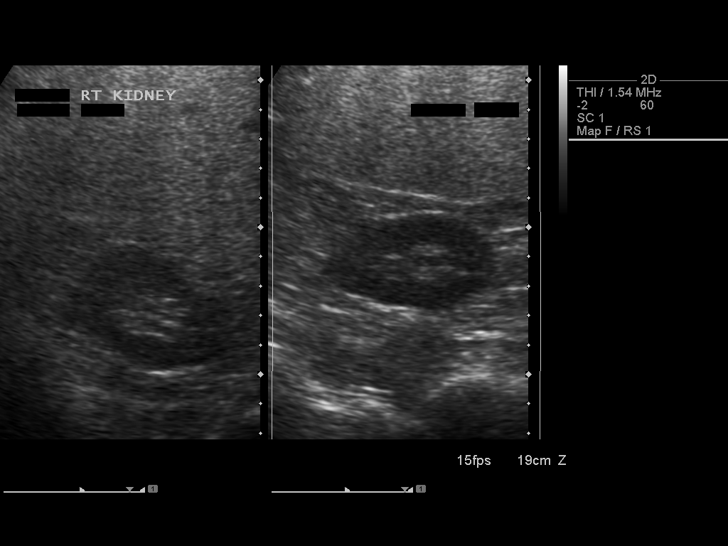
[im 49/74]
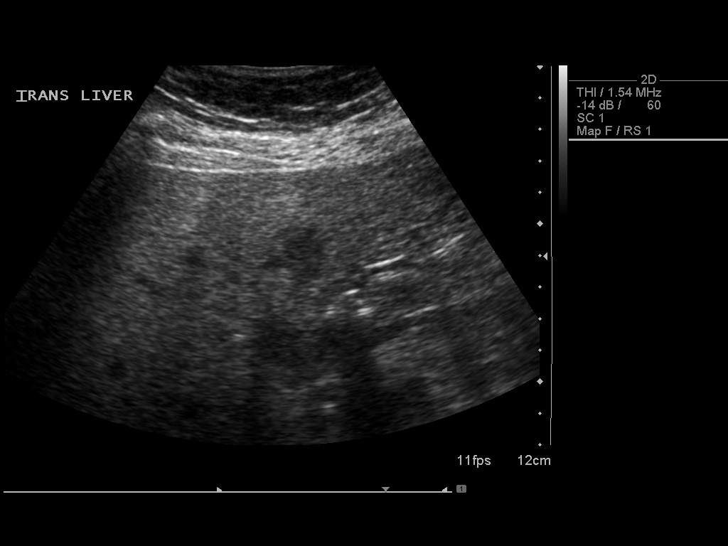
[im 55/74]
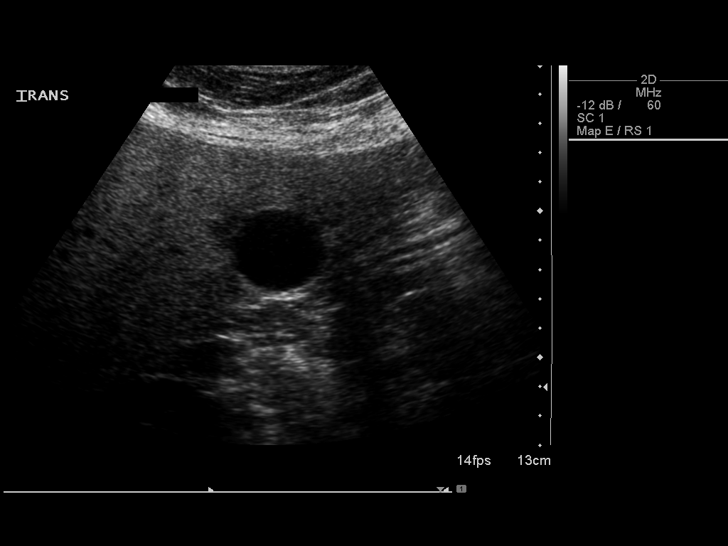
[im 61/74]
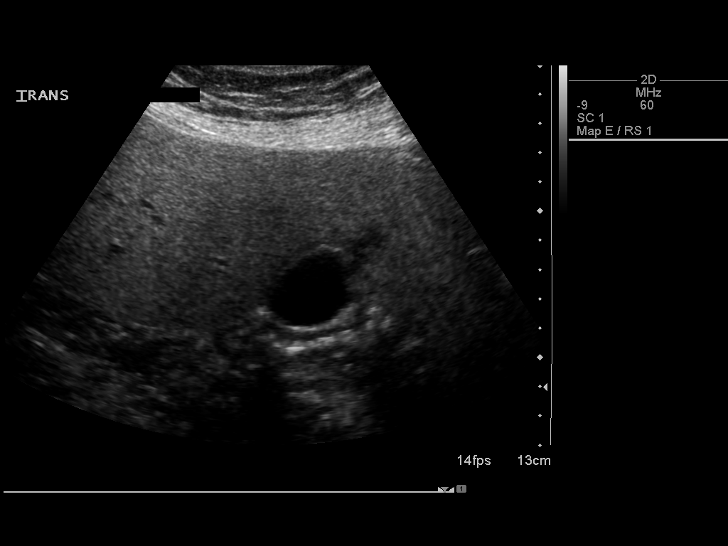
[im 67/74]
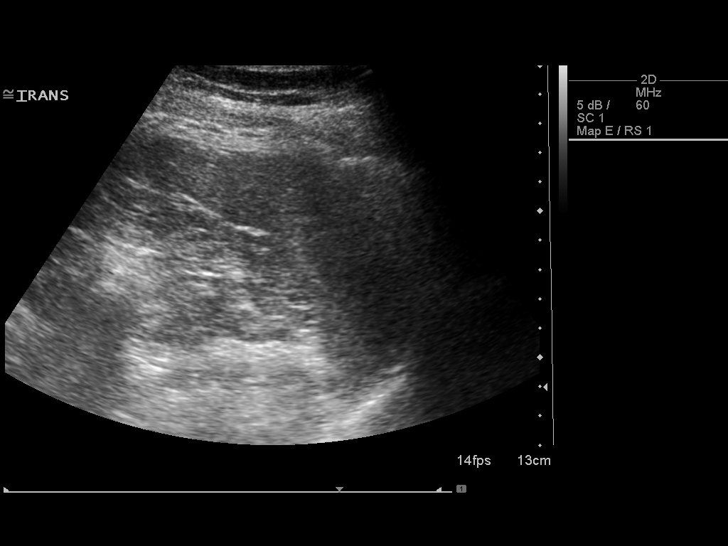
[im 74/74]
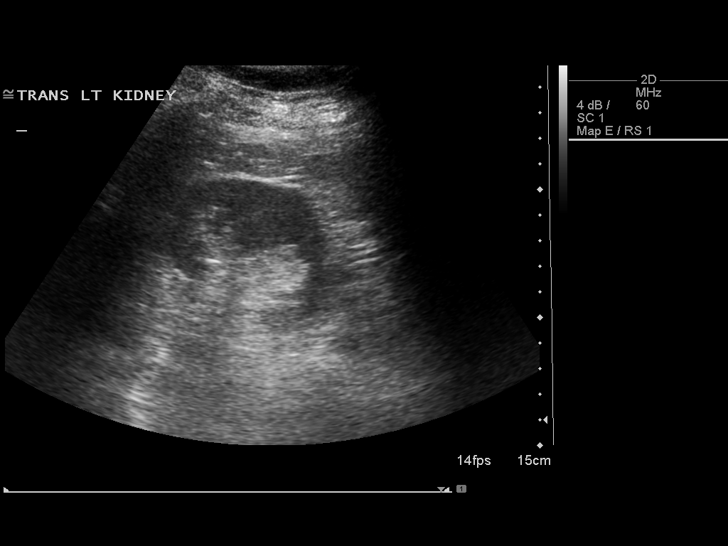

[13 of 25 positions shown; findings below may reference images not displayed]

FINDINGS: Gallbladder:

No gallstones or wall thickening visualized. No sonographic Murphy
sign noted.

Common bile duct:

Diameter: 4 mm

Liver:

Diffuse increased echogenicity, with poor acoustic transmission.
There are somewhat angular appearing areas of hypoechoic parenchyma
around the gallbladder fossa, consistent with fatty sparing. No mass
lesion identified. Antegrade flow in the imaged portal venous
system. The liver is enlarged, extending to the level of the right
renal pole. Hepatic length is at least 18 cm craniocaudal span.

IVC:

No abnormality visualized.

Pancreas:

Limited visualization. No evidence of ductal enlargement or other
abnormality.

Spleen:

Size and appearance within normal limits.

Right Kidney:

Length: 13 cm. Echogenicity within normal limits. No mass or
hydronephrosis visualized.

Left Kidney:

Length: 13 cm. Echogenicity within normal limits. No mass or
hydronephrosis visualized.

Abdominal aorta:

No aneurysm visualized (note that the aortic bifurcation is obscured
by bowel gas).

Other findings:

None.
IMPRESSION: Hepatomegaly and hepatic steatosis.

## 2015-11-21 ENCOUNTER — Encounter: Payer: Self-pay | Admitting: Cardiology

## 2015-11-21 ENCOUNTER — Other Ambulatory Visit: Payer: Self-pay

## 2015-11-21 DIAGNOSIS — Z1231 Encounter for screening mammogram for malignant neoplasm of breast: Secondary | ICD-10-CM

## 2015-11-22 ENCOUNTER — Ambulatory Visit (INDEPENDENT_AMBULATORY_CARE_PROVIDER_SITE_OTHER): Payer: BLUE CROSS/BLUE SHIELD | Admitting: Cardiology

## 2015-11-22 ENCOUNTER — Encounter: Payer: Self-pay | Admitting: Cardiology

## 2015-11-22 VITALS — BP 134/90 | HR 62 | Ht 66.0 in | Wt 237.1 lb

## 2015-11-22 DIAGNOSIS — R002 Palpitations: Secondary | ICD-10-CM

## 2015-11-22 DIAGNOSIS — R072 Precordial pain: Secondary | ICD-10-CM | POA: Diagnosis not present

## 2015-11-22 DIAGNOSIS — R079 Chest pain, unspecified: Secondary | ICD-10-CM | POA: Insufficient documentation

## 2015-11-22 DIAGNOSIS — R011 Cardiac murmur, unspecified: Secondary | ICD-10-CM

## 2015-11-22 NOTE — Progress Notes (Signed)
Cardiology Office Note    Date:  11/22/2015   ID:  Jamie Vaughn, DOB [birth year], MRN [id number]  PCP:  Gweneth Dimitri, MD  Cardiologist:   Tobias Alexander, MD  Referring physician: Gweneth Dimitri, MD  Chief complain: Palpitations  History of Present Illness:  Jamie Vaughn is a 60 y.o. female with prior medical history of obesity, hyperlipidemia, was coming for concern of palpitations. The patient states that she has been through very stressful situation with her father being diagnosed with progressive posttreatment cancer. She has noticed the palpitation first 6 months ago when she visited her father and getting file with her brother. The patient states that these have been every weekend. She wakes up at night with feeling of very strong and very fast heartbeat and a cane last several hours and can be associated with chest tightness but no dizziness, no syncope no shortness of breath. She never experiences her palpitations during the day and very occasionally at home when she is not listing father. She walks 4 times a week for 1 hour and doesn't experience any chest pain or shortness of breath with that. She has experienced chest pain on emotional stress that felt like pressure radiating to her left arm. She is compliant with her Crestor. And has no side effects. There is no family history of premature coronary artery disease, heart failure or sudden cardiac death. She denies any lower extremity edema, orthopnea or proximal nocturnal dyspnea.  Labs are normal creatinine in electrolytes normal LFTs, triglycerides 114, HDL 49, LDL 99 these were collected on 11/15/2015.  Past Medical History  Diagnosis Date  . Hyperlipemia   . Sleep disturbance     chronic  . Postmenopausal 08/2010  . GERD (gastroesophageal reflux disease)   . Herpes simplex without complication   . Anal fissure   . Constipation     unspecified  . Asymptomatic varicose veins   . Impaired fasting glucose   . Elevated BP       without diagnosis of hypertension    Past Surgical History  Procedure Laterality Date  . Excisional hemorrhoidectomy  2006  . Dilation and curettage of uterus  1983    Current Medications: Outpatient Prescriptions Prior to Visit  Medication Sig Dispense Refill  . ALPRAZolam (XANAX) 1 MG tablet Take 1 mg by mouth at bedtime as needed for anxiety.    . Cholecalciferol (VITAMIN D3) 2000 units capsule Take 2,000 Units by mouth daily.    . Coenzyme Q10 300 MG CAPS Take 2 capsules by mouth daily.    . Multiple Vitamins-Minerals (EYE VITAMINS) CAPS Take 1 capsule by mouth daily.    . naproxen (NAPROSYN) 500 MG tablet Take 500 mg by mouth 2 (two) times daily between meals as needed.    Marland Kitchen omeprazole (PRILOSEC OTC) 20 MG tablet Take 20 mg by mouth daily.    Marland Kitchen OVER THE COUNTER MEDICATION Take 1 Dose by mouth daily. curamed    . OVER THE COUNTER MEDICATION Take 1 tablet by mouth daily. Picolinate 800 mcg    . polyethylene glycol (MIRALAX / GLYCOLAX) packet Take 17 g by mouth at bedtime.    . rosuvastatin (CRESTOR) 20 MG tablet Take 20 mg by mouth daily.    . vitamin C (ASCORBIC ACID) 500 MG tablet Take 500 mg by mouth daily.     No facility-administered medications prior to visit.     Allergies:   Latex   Social History   Social History  . Marital Status: Single  Spouse Name: N/A  . Number of Children: N/A  . Years of Education: N/A   Occupational History  . PROPERTY MANAGER    Social History Main Topics  . Smoking status: Former Smoker    Quit date: 03/24/2011  . Smokeless tobacco: None  . Alcohol Use: 0.0 oz/week    0 Standard drinks or equivalent per week  . Drug Use: No  . Sexual Activity: Not Asked   Other Topics Concern  . None   Social History Narrative     Family History:  The patient's family history includes Breast cancer in her mother; Lung cancer in her mother; Melanoma in her father; Prostate cancer in her father; Thyroid cancer in her mother.   ROS:    Please see the history of present illness.    ROS All other systems reviewed and are negative.   PHYSICAL EXAM:   VS:  BP 134/90 mmHg  Pulse 62  Ht 5\' 6"  (1.676 m)  Wt 237 lb 1.9 oz (107.557 kg)  BMI 38.29 kg/m2  LMP 12/12/2011   GEN: Well nourished, well developed, in no acute distress HEENT: normal Neck: no JVD, carotid bruits, or masses Cardiac: RRR; no murmurs, rubs, or gallops,no edema  Respiratory:  clear to auscultation bilaterally, normal work of breathing GI: soft, nontender, nondistended, + BS MS: no deformity or atrophy Skin: warm and dry, no rash Neuro:  Alert and Oriented x 3, Strength and sensation are intact Psych: euthymic mood, full affect  Wt Readings from Last 3 Encounters:  11/22/15 237 lb 1.9 oz (107.557 kg)      Studies/Labs Reviewed:   EKG:  EKG is ordered today.  The ekg ordered today demonstrates Normal sinus rhythm and normal EKG.ent Labs: No results found for requested labs within last 365 days.   Lipid Panel No results found for: CHOL, TRIG, HDL, CHOLHDL, VLDL, LDLCALC, LDLDIRECT  Additional studies/ records that were reviewed today include:  Records reviewed from her PCP as described in history of present illness.    ASSESSMENT:    1. Heart palpitations   2. Palpitations   3. Precordial pain   4. Systolic murmur      PLAN:  In order of problems listed above:  1. We'll schedule an echocardiogram to evaluate for systolic and diastolic function, as well as a stress echocardiogram to evaluate for possible ischemia even though patient's chest pain is nonexertional but related to emotional stress. We will also order a 48-hour Holter monitor to further evaluate her arrhythmias. All labs reviewed and normal.    Medication Adjustments/Labs and Tests Ordered: Current medicines are reviewed at length with the patient today.  Concerns regarding medicines are outlined above.  Medication changes, Labs and Tests ordered today are listed in the  Patient Instructions below. Patient Instructions  Medication Instructions:   Your physician recommends that you continue on your current medications as directed. Please refer to the Current Medication list given to you today.    Testing/Procedures:  Your physician has requested that you have an echocardiogram. Echocardiography is a painless test that uses sound waves to create images of your heart. It provides your doctor with information about the size and shape of your heart and how well your heart's chambers and valves are working. This procedure takes approximately one hour. There are no restrictions for this procedure.   Your physician has requested that you have a stress echocardiogram. For further information please visit [email or web address] Please follow instruction sheet as given.   Your  physician has recommended that you wear a 48 hour holter monitor. Holter monitors are medical devices that record the heart's electrical activity. Doctors most often use these monitors to diagnose arrhythmias. Arrhythmias are problems with the speed or rhythm of the heartbeat. The monitor is a small, portable device. You can wear one while you do your normal daily activities. This is usually used to diagnose what is causing palpitations/syncope (passing out).    Follow-Up:  3 MONTHS WITH DR Delton SeeNELSON       If you need a refill on your cardiac medications before your next appointment, please call your pharmacy.       Signed, Tobias AlexanderKatarina Kimani Bedoya, MD  11/22/2015 3:39 PM    [facility] [address], [place], [place]  [address] Phone: [phone]; Fax: [phone]

## 2015-11-22 NOTE — Patient Instructions (Signed)
Medication Instructions:   Your physician recommends that you continue on your current medications as directed. Please refer to the Current Medication list given to you today.    Testing/Procedures:  Your physician has requested that you have an echocardiogram. Echocardiography is a painless test that uses sound waves to create images of your heart. It provides your doctor with information about the size and shape of your heart and how well your heart's chambers and valves are working. This procedure takes approximately one hour. There are no restrictions for this procedure.   Your physician has requested that you have a stress echocardiogram. For further information please visit [email or web address]. Please follow instruction sheet as given.   Your physician has recommended that you wear a 48 hour holter monitor. Holter monitors are medical devices that record the heart's electrical activity. Doctors most often use these monitors to diagnose arrhythmias. Arrhythmias are problems with the speed or rhythm of the heartbeat. The monitor is a small, portable device. You can wear one while you do your normal daily activities. This is usually used to diagnose what is causing palpitations/syncope (passing out).    Follow-Up:  3 MONTHS WITH DR Delton SeeNELSON       If you need a refill on your cardiac medications before your next appointment, please call your pharmacy.

## 2015-11-28 ENCOUNTER — Encounter: Payer: Self-pay | Admitting: Cardiology

## 2015-12-19 ENCOUNTER — Telehealth (HOSPITAL_COMMUNITY): Payer: Self-pay | Admitting: *Deleted

## 2015-12-19 NOTE — Telephone Encounter (Signed)
Patient given detailed instructions per Stress Test Requisition Sheet for test on 12/21/15 at 1:00.Patient Notified to arrive 30 minutes early, and that it is imperative to arrive on time for appointment to keep from having the test rescheduled.  Patient verbalized understanding. Jamie DolinSharon S Brooks

## 2015-12-21 ENCOUNTER — Ambulatory Visit (HOSPITAL_BASED_OUTPATIENT_CLINIC_OR_DEPARTMENT_OTHER): Payer: BLUE CROSS/BLUE SHIELD

## 2015-12-21 ENCOUNTER — Other Ambulatory Visit: Payer: Self-pay

## 2015-12-21 ENCOUNTER — Ambulatory Visit (HOSPITAL_COMMUNITY): Payer: BLUE CROSS/BLUE SHIELD | Attending: Cardiology

## 2015-12-21 DIAGNOSIS — I340 Nonrheumatic mitral (valve) insufficiency: Secondary | ICD-10-CM | POA: Insufficient documentation

## 2015-12-21 DIAGNOSIS — R0989 Other specified symptoms and signs involving the circulatory and respiratory systems: Secondary | ICD-10-CM

## 2015-12-21 DIAGNOSIS — R011 Cardiac murmur, unspecified: Secondary | ICD-10-CM | POA: Insufficient documentation

## 2015-12-21 DIAGNOSIS — I77810 Thoracic aortic ectasia: Secondary | ICD-10-CM | POA: Diagnosis not present

## 2015-12-21 DIAGNOSIS — R002 Palpitations: Secondary | ICD-10-CM

## 2015-12-21 DIAGNOSIS — R072 Precordial pain: Secondary | ICD-10-CM | POA: Diagnosis not present

## 2015-12-21 DIAGNOSIS — E785 Hyperlipidemia, unspecified: Secondary | ICD-10-CM | POA: Insufficient documentation

## 2015-12-21 DIAGNOSIS — Z87891 Personal history of nicotine dependence: Secondary | ICD-10-CM | POA: Insufficient documentation

## 2015-12-21 DIAGNOSIS — I517 Cardiomegaly: Secondary | ICD-10-CM | POA: Insufficient documentation

## 2015-12-21 DIAGNOSIS — Z6838 Body mass index (BMI) 38.0-38.9, adult: Secondary | ICD-10-CM | POA: Diagnosis not present

## 2015-12-21 DIAGNOSIS — E669 Obesity, unspecified: Secondary | ICD-10-CM | POA: Diagnosis not present

## 2015-12-21 HISTORY — PX: TRANSTHORACIC ECHOCARDIOGRAM: SHX275

## 2015-12-21 LAB — ECHOCARDIOGRAM COMPLETE
Ao-asc: 36 cm
E decel time: 282 msec
E/e' ratio: 10.27
FS: 45 % — AB (ref 28–44)
IVS/LV PW RATIO, ED: 0.8
LA ID, A-P, ES: 41 mm
LA diam end sys: 41 mm
LA diam index: 1.91 cm/m2
LA vol A4C: 66.7 ml
LA vol index: 34.6 mL/m2
LA vol: 74.4 mL
LV E/e' medial: 10.27
LV E/e'average: 10.27
LV PW d: 13.3 mm — AB (ref 0.6–1.1)
LV e' LATERAL: 8.27 cm/s
LVOT SV: 104 mL
LVOT VTI: 33.2 cm
LVOT area: 3.14 cm2
LVOT diameter: 20 mm
LVOT peak grad rest: 10 mmHg
LVOT peak vel: 155 cm/s
MV Dec: 282
MV Peak grad: 3 mmHg
MV pk A vel: 68.4 m/s
MV pk E vel: 84.9 m/s
Reg peak vel: 210 cm/s
TAPSE: 23 mm
TDI e' lateral: 8.27
TDI e' medial: 8.59
TR max vel: 210 cm/s

## 2015-12-22 ENCOUNTER — Ambulatory Visit
Admission: RE | Admit: 2015-12-22 | Discharge: 2015-12-22 | Disposition: A | Discharge status: Home / Self Care | Payer: BLUE CROSS/BLUE SHIELD | Source: Ambulatory Visit

## 2015-12-22 DIAGNOSIS — Z1231 Encounter for screening mammogram for malignant neoplasm of breast: Secondary | ICD-10-CM

## 2016-01-02 ENCOUNTER — Telehealth: Payer: Self-pay | Admitting: Cardiology

## 2016-01-02 NOTE — Telephone Encounter (Signed)
I called pt this morning to schedule the monitor appt. Patient advised she did not want to have the monitor attached. I have removed the order.

## 2016-01-03 ENCOUNTER — Telehealth: Payer: Self-pay | Admitting: *Deleted

## 2016-01-03 MED ORDER — LISINOPRIL 10 MG PO TABS: 10.0000 mg | ORAL_TABLET | Freq: Every day | ORAL | Status: DC

## 2016-01-03 NOTE — Telephone Encounter (Signed)
-----   Message from Lorayne BenderAnita E Harding, RN sent at 01/03/2016  7:47 AM EDT -----   ----- Message -----    From: Lars MassonKatarina H Nelson, MD    Sent: 01/02/2016   7:55 AM      To: [facility] Triage  She has normal LVEF and no ischemia = no blockages, however hypertensive response to exertion, I would increase lisinopril to 10 mg po daily.

## 2016-01-03 NOTE — Telephone Encounter (Signed)
Notified the pt that per Dr Delton SeeNelson, her stress echo showed normal LVEF and no ischemia, however she did have a hypertensive response to exertion and recommends that we increase her lisinopril 10 mg po daily.  Confirmed the pharmacy of choice with the pt.  Pt verbalized understanding and agrees with this plan.

## 2016-02-14 ENCOUNTER — Encounter: Payer: Self-pay | Admitting: *Deleted

## 2016-02-28 ENCOUNTER — Encounter (INDEPENDENT_AMBULATORY_CARE_PROVIDER_SITE_OTHER): Payer: Self-pay

## 2016-02-28 ENCOUNTER — Encounter: Payer: Self-pay | Admitting: Cardiology

## 2016-02-28 ENCOUNTER — Ambulatory Visit (INDEPENDENT_AMBULATORY_CARE_PROVIDER_SITE_OTHER): Payer: BLUE CROSS/BLUE SHIELD | Admitting: Cardiology

## 2016-02-28 VITALS — BP 124/80 | HR 67 | Ht 66.0 in | Wt 239.0 lb

## 2016-02-28 DIAGNOSIS — I119 Hypertensive heart disease without heart failure: Secondary | ICD-10-CM

## 2016-02-28 DIAGNOSIS — E785 Hyperlipidemia, unspecified: Secondary | ICD-10-CM | POA: Diagnosis not present

## 2016-02-28 DIAGNOSIS — R072 Precordial pain: Secondary | ICD-10-CM | POA: Diagnosis not present

## 2016-02-28 NOTE — Progress Notes (Signed)
Cardiology Office Note    Date:  02/28/2016   ID:  Jamie Jamie A Bulow, DOB 29-Jun-1955, MRN [id number]  PCP:  Gweneth DimitriMCNEILL,WENDY, MD  Cardiologist:   Tobias AlexanderKatarina Devine Klingel, MD  Referring physician: Gweneth DimitriMCNEILL,WENDY, MD  Chief complain: Palpitations  History of Present Illness:  Jamie Vaughn is a 60 y.o. female with prior medical history of obesity, hyperlipidemia, was coming for concern of palpitations. The patient states that she has been through very stressful situation with her father being diagnosed with progressive posttreatment cancer. She has noticed the palpitation first 6 months ago when she visited her father and getting file with her brother. The patient states that these have been every weekend. She wakes up at night with feeling of very strong and very fast heartbeat and a cane last several hours and can be associated with chest tightness but no dizziness, no syncope no shortness of breath. She never experiences her palpitations during the day and very occasionally at home when she is not listing father. She walks 4 times a week for 1 hour and doesn't experience any chest pain or shortness of breath with that. She has experienced chest pain on emotional stress that felt like pressure radiating to her left arm. She is compliant with her Crestor. And has no side effects. There is no family history of premature coronary artery disease, heart failure or sudden cardiac death. She denies any lower extremity edema, orthopnea or proximal nocturnal dyspnea.  Labs are normal creatinine in electrolytes normal LFTs, triglycerides 114, HDL 49, LDL 99 these were collected on 11/15/2015.  02/28/2016 - this is a 3 months follow-up, the patient underwent stress echocardiogram that showed no ischemia and normal exercise tolerance, however her blood pressure increased to 230 at peak exercise. Her lisinopril was increased to 10 mg daily and she was advised to start regular exercise. She is tolerating Crestor well. She  states that now knowing that her stress test is normal she feels overall much better. Denies any palpitations or syncope. No lower extremity edema orthopnea or proximal nocturnal dyspnea.   Past Medical History:  Diagnosis Date  . Anal fissure   . Asymptomatic varicose veins   . Constipation    unspecified  . Elevated BP    without diagnosis of hypertension  . GERD (gastroesophageal reflux disease)   . Herpes simplex without complication   . Hyperlipemia   . Impaired fasting glucose   . Postmenopausal 08/2010  . Sleep disturbance    chronic   Past Surgical History:  Procedure Laterality Date  . DILATION AND CURETTAGE OF UTERUS  1983  . EXCISIONAL HEMORRHOIDECTOMY  2006   Current Medications: Outpatient Medications Prior to Visit  Medication Sig Dispense Refill  . ALPRAZolam (XANAX) 1 MG tablet Take 1 mg by mouth at bedtime as needed for anxiety.    . Cholecalciferol (VITAMIN D3) 2000 units capsule Take 2,000 Units by mouth daily.    . Coenzyme Q10 300 MG CAPS Take 2 capsules by mouth daily.    Marland Kitchen. lisinopril (PRINIVIL,ZESTRIL) 10 MG tablet Take 1 tablet (10 mg total) by mouth daily. 90 tablet 3  . Multiple Vitamins-Minerals (EYE VITAMINS) CAPS Take 1 capsule by mouth daily.    . naproxen (NAPROSYN) 500 MG tablet Take 500 mg by mouth 2 (two) times daily between meals as needed.    Marland Kitchen. omeprazole (PRILOSEC OTC) 20 MG tablet Take 20 mg by mouth daily.    Marland Kitchen. OVER THE COUNTER MEDICATION Take 1 Dose by mouth daily. curamed    .  OVER THE COUNTER MEDICATION Take 1 tablet by mouth daily. Picolinate 800 mcg    . polyethylene glycol (MIRALAX / GLYCOLAX) packet Take 17 g by mouth at bedtime.    . rosuvastatin (CRESTOR) 20 MG tablet Take 20 mg by mouth daily.    . vitamin C (ASCORBIC ACID) 500 MG tablet Take 500 mg by mouth daily.     No facility-administered medications prior to visit.      Allergies:   Latex   Social History   Social History  . Marital status: Single    Spouse name:  N/A  . Number of children: N/A  . Years of education: N/A   Occupational History  . PROPERTY MANAGER    Social History Main Topics  . Smoking status: Former Smoker    Quit date: 03/24/2011  . Smokeless tobacco: None  . Alcohol use 0.0 oz/week  . Drug use: No  . Sexual activity: Not Asked   Other Topics Concern  . None   Social History Narrative  . None     Family History:  The patient's family history includes Breast cancer in her mother; Lung cancer in her mother; Melanoma in her father; Prostate cancer in her father; Thyroid cancer in her mother.   ROS:   Please see the history of present illness.    ROS All other systems reviewed and are negative.   PHYSICAL EXAM:   VS:  BP 124/80   Pulse 67   Ht 5\' 6"  (1.676 m)   Wt 239 lb (108.4 kg)   LMP 12/12/2011   BMI 38.58 kg/m    GEN: Well nourished, well developed, in no acute distress HEENT: normal Neck: no JVD, carotid bruits, or masses Cardiac: RRR; no murmurs, rubs, or gallops,no edema  Respiratory:  clear to auscultation bilaterally, normal work of breathing GI: soft, nontender, nondistended, + BS MS: no deformity or atrophy Skin: warm and dry, no rash Neuro:  Alert and Oriented x 3, Strength and sensation are intact Psych: euthymic mood, full affect  Wt Readings from Last 3 Encounters:  02/28/16 239 lb (108.4 kg)  11/22/15 237 lb 1.9 oz (107.6 kg)    Studies/Labs Reviewed:   EKG:  EKG is ordered today.  The ekg ordered today demonstrates Normal sinus rhythm and normal EKG.ent Labs: No results found for requested labs within last 8760 hours.   Lipid Panel No results found for: CHOL, TRIG, HDL, CHOLHDL, VLDL, LDLCALC, LDLDIRECT  Additional studies/ records that were reviewed today include:  Records reviewed from her PCP as described in history of present illness.    ASSESSMENT:    1. Precordial pain   2. HLD (hyperlipidemia)   3. Hypertensive heart disease without heart failure     PLAN:  In order  of problems listed above:  1. Hypertensive heart disease without heart failure, echocardiogram showed normal LVEF is greater than diastolic dysfunction and mild concentric LVH stress test followed blood pressure elevation on exertion to 230 lisinopril was increased to 10 mg daily, she is encouraged to start regular exercise 4 times a week for half an hour 2. Hyperlipidemia currently on Crestor 20 mg daily that she is tolerating well follow-up she'll be checked by her primary care physician will follow. 3. Chest pain most probably caused by severe hypertensive response on exertion, dizziness has improved.    Medication Adjustments/Labs and Tests Ordered: Current medicines are reviewed at length with the patient today.  Concerns regarding medicines are outlined above.  Medication changes, Labs  and Tests ordered today are listed in the Patient Instructions below. Patient Instructions  Medication Instructions:   Your physician recommends that you continue on your current medications as directed. Please refer to the Current Medication list given to you today.    Follow-Up:  Your physician wants you to follow-up in: ONE YEAR WITH DR Johnell Comings will receive a reminder letter in the mail two months in advance. If you don't receive a letter, please call our office to schedule the follow-up appointment.        If you need a refill on your cardiac medications before your next appointment, please call your pharmacy.      Signed, Tobias Alexander, MD  02/28/2016 2:03 PM    [facility] [address] [place], [place], [place]  [address] Phone: [phone]; Fax: [phone]

## 2016-02-28 NOTE — Patient Instructions (Signed)

## 2016-05-15 IMAGING — MG MM SCREENING BREAST TOMO BILATERAL
8 series · 8 of 24 positions shown · non-contrast
Comparison: Previous exam(s).

CLINICAL DATA: Screening.

EXAM:
DIGITAL SCREENING BILATERAL MAMMOGRAM WITH 3D TOMO WITH CAD

[L MLO]
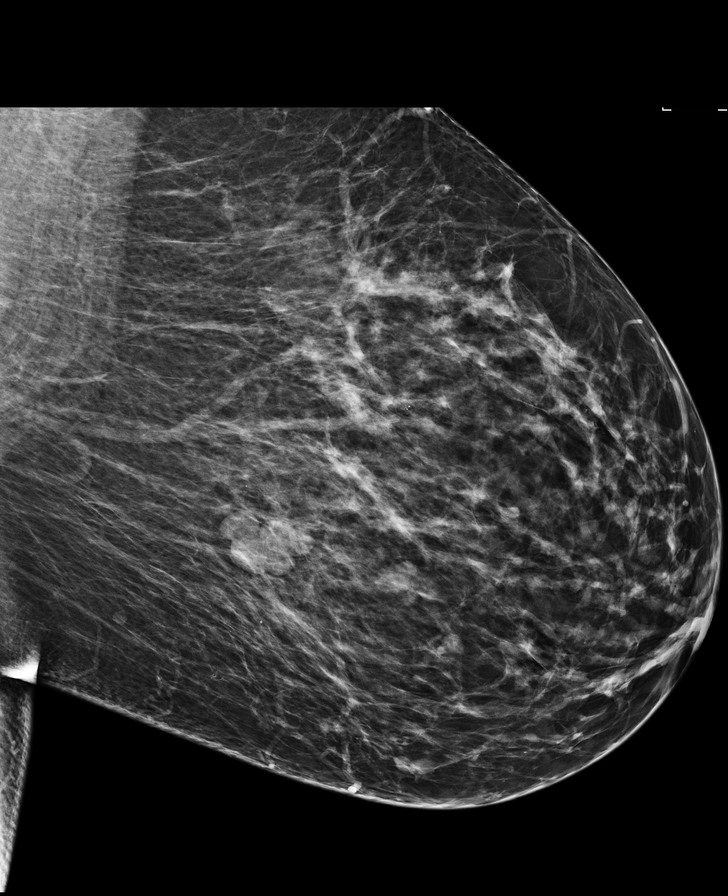

[R CC]
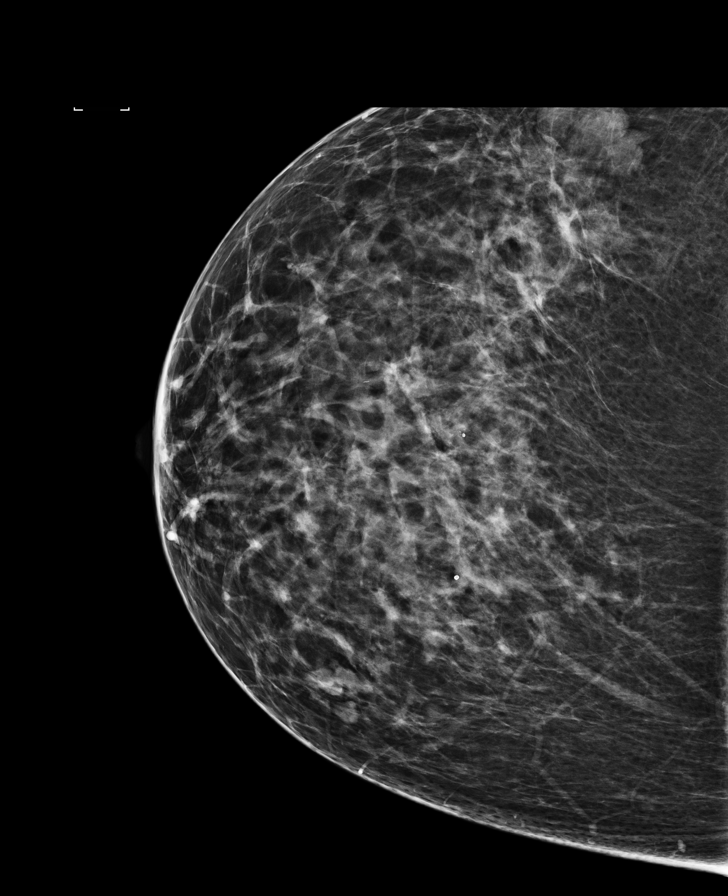

[R MLO]
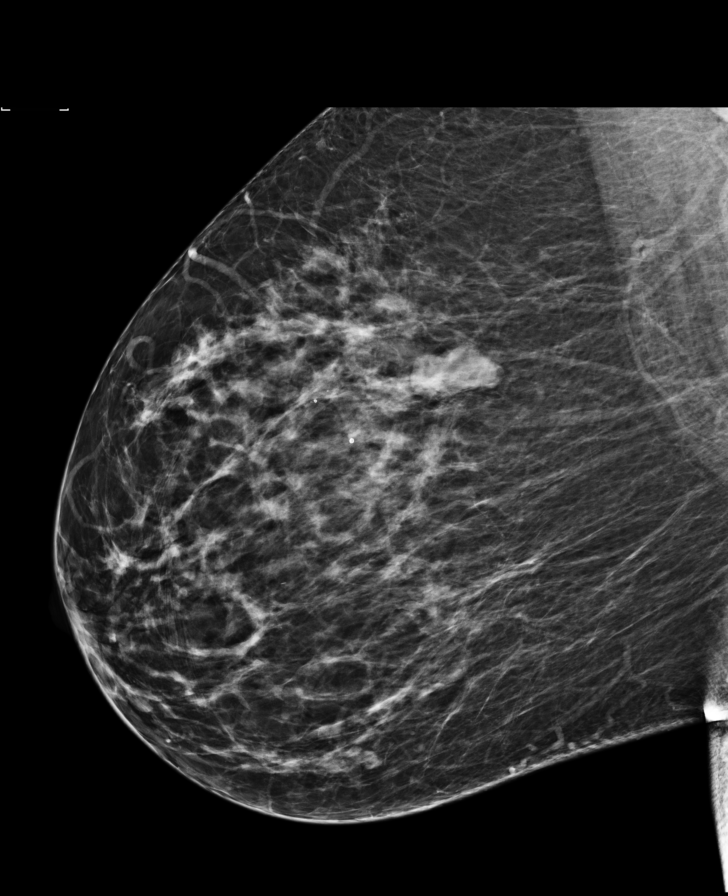

[L CC]
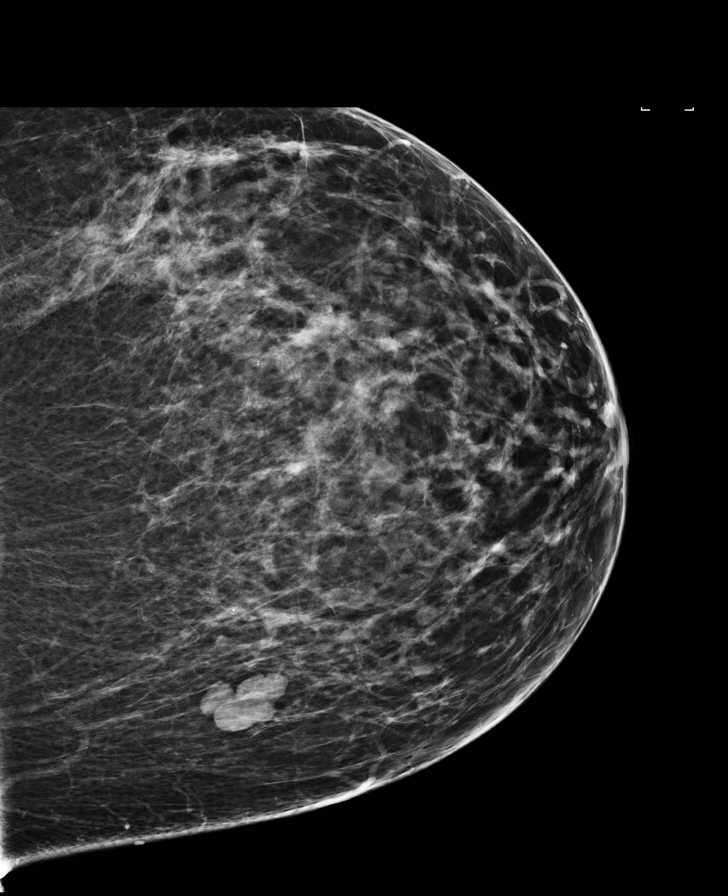

[R CC tomo · tomo slice 37/72.0]
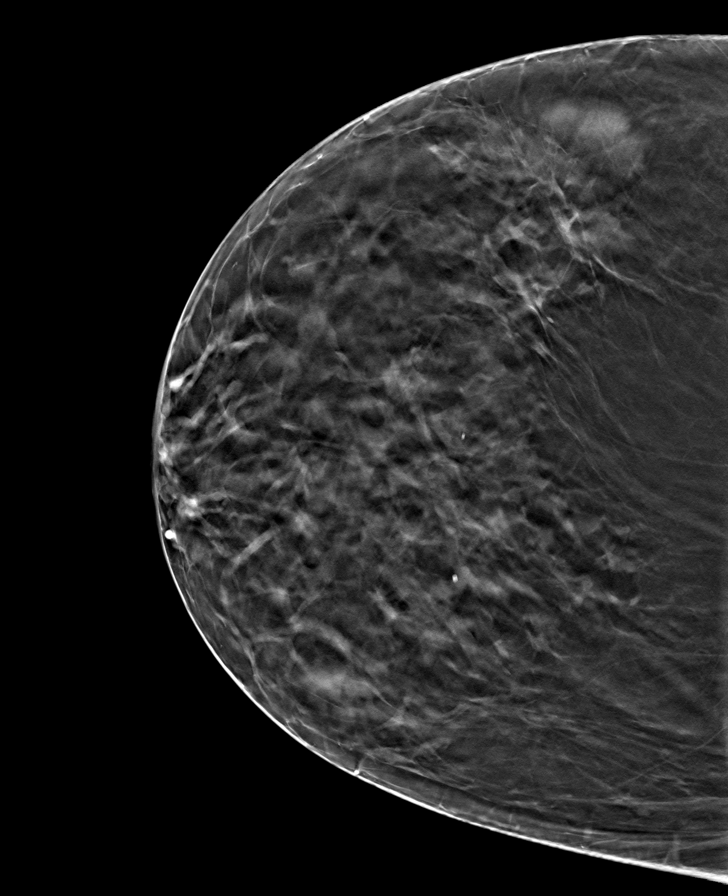

[L CC tomo · tomo slice 38/75.0]
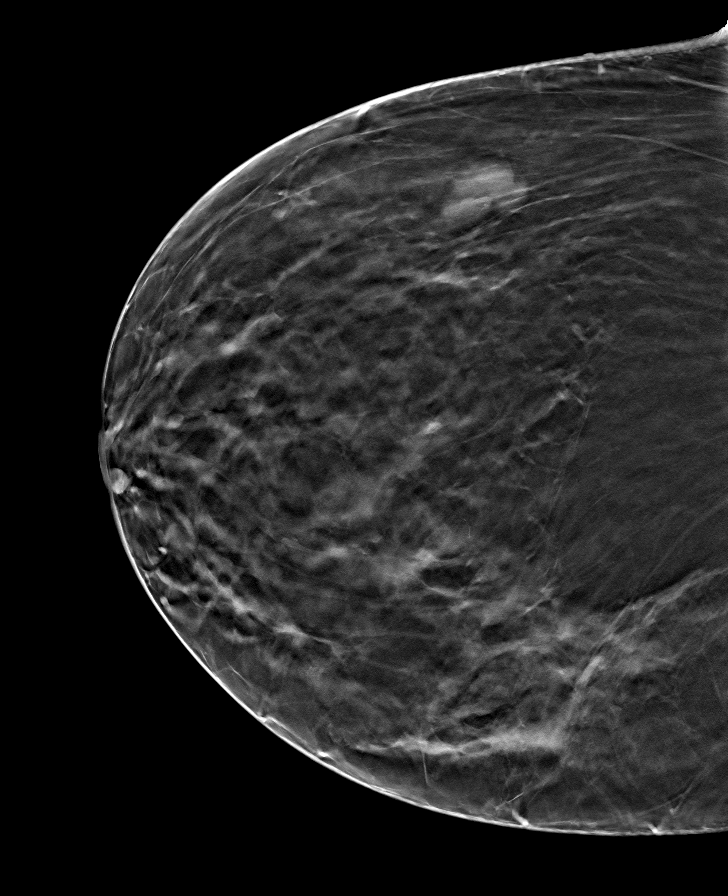

[R MLO tomo · tomo slice 41/82.0]
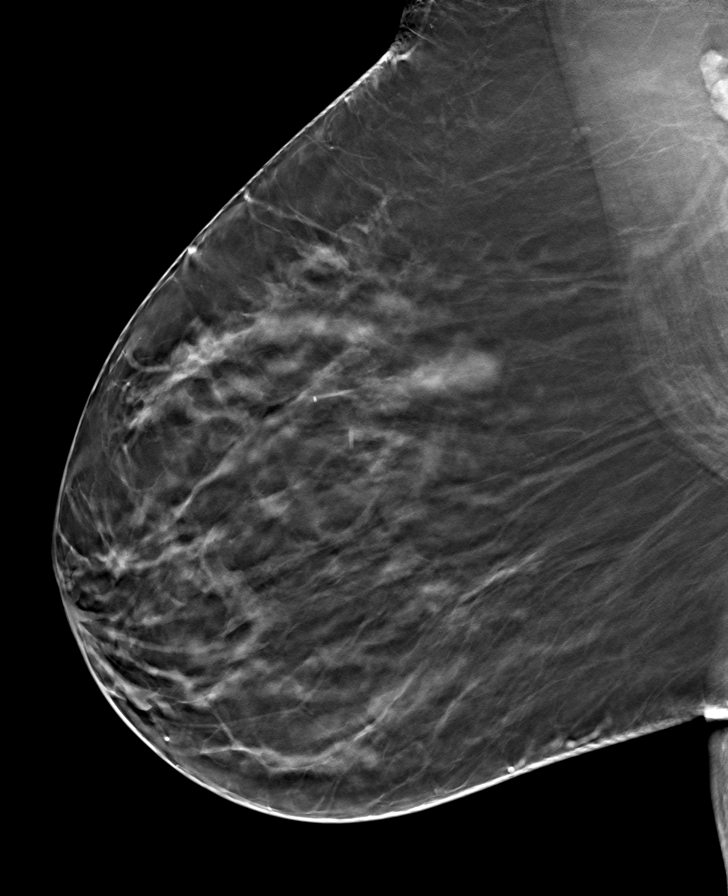

[L MLO tomo · tomo slice 43/85.0]
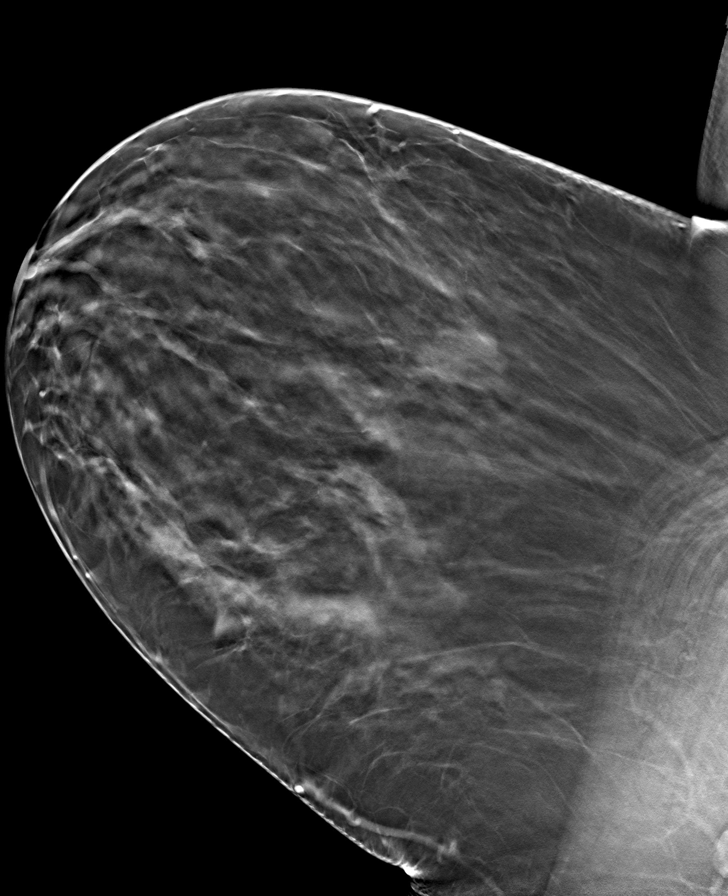

[8 of 24 positions shown; findings below may reference images not displayed]

ACR Breast Density Category c: The breast tissue is heterogeneously
dense, which may obscure small masses.
FINDINGS: There are no findings suspicious for malignancy. Images were
processed with CAD.
IMPRESSION: No mammographic evidence of malignancy. A result letter of this
screening mammogram will be mailed directly to the patient.

RECOMMENDATION:
Screening mammogram in one year. (Code:[id number])

BI-RADS CATEGORY  1: Negative.

## 2016-11-12 ENCOUNTER — Other Ambulatory Visit: Payer: Self-pay | Admitting: Family Medicine

## 2016-11-12 DIAGNOSIS — Z1231 Encounter for screening mammogram for malignant neoplasm of breast: Secondary | ICD-10-CM

## 2016-11-21 ENCOUNTER — Other Ambulatory Visit: Payer: Self-pay | Admitting: Family Medicine

## 2016-11-21 DIAGNOSIS — N950 Postmenopausal bleeding: Secondary | ICD-10-CM

## 2016-11-27 ENCOUNTER — Ambulatory Visit
Admission: RE | Admit: 2016-11-27 | Discharge: 2016-11-27 | Disposition: A | Discharge status: Home / Self Care | Payer: BLUE CROSS/BLUE SHIELD | Source: Ambulatory Visit | Attending: Family Medicine | Admitting: Family Medicine

## 2016-11-27 DIAGNOSIS — N950 Postmenopausal bleeding: Secondary | ICD-10-CM

## 2016-12-11 ENCOUNTER — Ambulatory Visit: Payer: BLUE CROSS/BLUE SHIELD

## 2016-12-17 ENCOUNTER — Ambulatory Visit: Payer: BLUE CROSS/BLUE SHIELD

## 2016-12-17 ENCOUNTER — Ambulatory Visit
Admission: RE | Admit: 2016-12-17 | Discharge: 2016-12-17 | Disposition: A | Discharge status: Home / Self Care | Payer: BLUE CROSS/BLUE SHIELD | Source: Ambulatory Visit | Attending: Family Medicine | Admitting: Family Medicine

## 2016-12-17 DIAGNOSIS — Z1231 Encounter for screening mammogram for malignant neoplasm of breast: Secondary | ICD-10-CM

## 2017-01-09 ENCOUNTER — Other Ambulatory Visit (HOSPITAL_COMMUNITY)
Admission: RE | Admit: 2017-01-09 | Discharge: 2017-01-09 | Disposition: A | Discharge status: Home / Self Care | Payer: BLUE CROSS/BLUE SHIELD | Source: Ambulatory Visit | Attending: Obstetrics and Gynecology | Admitting: Obstetrics and Gynecology

## 2017-01-09 ENCOUNTER — Other Ambulatory Visit: Payer: Self-pay | Admitting: Obstetrics and Gynecology

## 2017-01-09 DIAGNOSIS — Z124 Encounter for screening for malignant neoplasm of cervix: Secondary | ICD-10-CM | POA: Insufficient documentation

## 2017-01-13 LAB — CYTOLOGY - PAP
DIAGNOSIS: NEGATIVE
HPV (WINDOPATH): NOT DETECTED

## 2017-01-20 ENCOUNTER — Encounter (HOSPITAL_BASED_OUTPATIENT_CLINIC_OR_DEPARTMENT_OTHER): Payer: Self-pay | Admitting: *Deleted

## 2017-01-20 NOTE — Progress Notes (Signed)
NPO AFTER MN W/ EXCEPTION CLEAR LIQUIDS UNTIL 0630.  ARRIVE AT 1030.  NEEDS CBC , ISTAT AND EKG.

## 2017-01-21 ENCOUNTER — Encounter (HOSPITAL_BASED_OUTPATIENT_CLINIC_OR_DEPARTMENT_OTHER)
Admission: RE | Disposition: A | Discharge status: Home / Self Care | Payer: Self-pay | Source: Ambulatory Visit | Attending: Obstetrics and Gynecology

## 2017-01-21 ENCOUNTER — Ambulatory Visit (HOSPITAL_BASED_OUTPATIENT_CLINIC_OR_DEPARTMENT_OTHER)
Admission: RE | Admit: 2017-01-21 | Discharge: 2017-01-21 | Disposition: A | Discharge status: Home / Self Care | Payer: BLUE CROSS/BLUE SHIELD | Source: Ambulatory Visit | Attending: Obstetrics and Gynecology | Admitting: Obstetrics and Gynecology

## 2017-01-21 ENCOUNTER — Ambulatory Visit (HOSPITAL_BASED_OUTPATIENT_CLINIC_OR_DEPARTMENT_OTHER): Payer: BLUE CROSS/BLUE SHIELD | Admitting: Anesthesiology

## 2017-01-21 ENCOUNTER — Encounter (HOSPITAL_BASED_OUTPATIENT_CLINIC_OR_DEPARTMENT_OTHER): Payer: Self-pay

## 2017-01-21 DIAGNOSIS — E785 Hyperlipidemia, unspecified: Secondary | ICD-10-CM | POA: Insufficient documentation

## 2017-01-21 DIAGNOSIS — N840 Polyp of corpus uteri: Secondary | ICD-10-CM | POA: Diagnosis not present

## 2017-01-21 DIAGNOSIS — I10 Essential (primary) hypertension: Secondary | ICD-10-CM | POA: Insufficient documentation

## 2017-01-21 DIAGNOSIS — Z87891 Personal history of nicotine dependence: Secondary | ICD-10-CM | POA: Insufficient documentation

## 2017-01-21 DIAGNOSIS — N950 Postmenopausal bleeding: Secondary | ICD-10-CM | POA: Diagnosis present

## 2017-01-21 DIAGNOSIS — K219 Gastro-esophageal reflux disease without esophagitis: Secondary | ICD-10-CM | POA: Diagnosis not present

## 2017-01-21 HISTORY — DX: Hyperlipidemia, unspecified: E78.5

## 2017-01-21 HISTORY — DX: Other constipation: K59.09

## 2017-01-21 HISTORY — DX: Presence of spectacles and contact lenses: Z97.3

## 2017-01-21 HISTORY — PX: HYSTEROSCOPY WITH D & C: SHX1775

## 2017-01-21 HISTORY — DX: Prediabetes: R73.03

## 2017-01-21 HISTORY — DX: Essential (primary) hypertension: I10

## 2017-01-21 LAB — POCT I-STAT 4, (NA,K, GLUC, HGB,HCT)
GLUCOSE: 102 mg/dL — AB (ref 65–99)
HEMATOCRIT: 42 % (ref 36.0–46.0)
HEMOGLOBIN: 14.3 g/dL (ref 12.0–15.0)
POTASSIUM: 4 mmol/L (ref 3.5–5.1)
SODIUM: 141 mmol/L (ref 135–145)

## 2017-01-21 SURGERY — DILATATION AND CURETTAGE /HYSTEROSCOPY
Anesthesia: General | Site: Vagina

## 2017-01-21 MED ORDER — MIDAZOLAM HCL 5 MG/5ML IJ SOLN
INTRAMUSCULAR | Status: DC | PRN
  Administered 2017-01-21: 2 mg via INTRAVENOUS

## 2017-01-21 MED ORDER — LIDOCAINE HCL 2 % IJ SOLN
INTRAMUSCULAR | Status: AC
  Filled 2017-01-21: qty 20

## 2017-01-21 MED ORDER — MIDAZOLAM HCL 2 MG/2ML IJ SOLN
INTRAMUSCULAR | Status: AC
  Filled 2017-01-21: qty 2

## 2017-01-21 MED ORDER — PROPOFOL 10 MG/ML IV BOLUS
INTRAVENOUS | Status: AC
  Filled 2017-01-21: qty 20

## 2017-01-21 MED ORDER — SILVER NITRATE-POT NITRATE 75-25 % EX MISC
CUTANEOUS | Status: DC | PRN
  Administered 2017-01-21: 1

## 2017-01-21 MED ORDER — FENTANYL CITRATE (PF) 100 MCG/2ML IJ SOLN
INTRAMUSCULAR | Status: AC
  Filled 2017-01-21: qty 2

## 2017-01-21 MED ORDER — HYDRALAZINE HCL 20 MG/ML IJ SOLN
INTRAMUSCULAR | Status: AC
  Filled 2017-01-21: qty 1

## 2017-01-21 MED ORDER — PROPOFOL 10 MG/ML IV BOLUS
INTRAVENOUS | Status: DC | PRN
  Administered 2017-01-21: 200 mg via INTRAVENOUS

## 2017-01-21 MED ORDER — DEXAMETHASONE SODIUM PHOSPHATE 10 MG/ML IJ SOLN
INTRAMUSCULAR | Status: AC
Start: 2017-01-21 — End: 2017-01-21
  Filled 2017-01-21: qty 1

## 2017-01-21 MED ORDER — OXYCODONE HCL 5 MG PO TABS
5.0000 mg | ORAL_TABLET | Freq: Once | ORAL | Status: AC | PRN
  Administered 2017-01-21: 5 mg via ORAL
  Filled 2017-01-21: qty 1

## 2017-01-21 MED ORDER — ONDANSETRON HCL 4 MG/2ML IJ SOLN
INTRAMUSCULAR | Status: AC
Start: 2017-01-21 — End: 2017-01-21
  Filled 2017-01-21: qty 2

## 2017-01-21 MED ORDER — OXYCODONE HCL 5 MG PO TABS
ORAL_TABLET | ORAL | Status: AC
  Filled 2017-01-21: qty 1

## 2017-01-21 MED ORDER — FENTANYL CITRATE (PF) 100 MCG/2ML IJ SOLN
INTRAMUSCULAR | Status: DC | PRN
  Administered 2017-01-21 (×2): 50 ug via INTRAVENOUS

## 2017-01-21 MED ORDER — IBUPROFEN 600 MG PO TABS: 600.0000 mg | ORAL_TABLET | Freq: Four times a day (QID) | ORAL | 0 refills | Status: DC | PRN

## 2017-01-21 MED ORDER — OXYCODONE HCL 5 MG/5ML PO SOLN
5.0000 mg | Freq: Once | ORAL | Status: AC | PRN
  Filled 2017-01-21: qty 5

## 2017-01-21 MED ORDER — HYDRALAZINE HCL 20 MG/ML IJ SOLN
5.0000 mg | INTRAMUSCULAR | Status: DC | PRN
  Administered 2017-01-21 (×2): 5 mg via INTRAVENOUS
  Filled 2017-01-21: qty 0.25

## 2017-01-21 MED ORDER — LIDOCAINE HCL 2 % IJ SOLN
INTRAMUSCULAR | Status: DC | PRN
  Administered 2017-01-21: 10 mL

## 2017-01-21 MED ORDER — KETOROLAC TROMETHAMINE 30 MG/ML IJ SOLN
INTRAMUSCULAR | Status: DC | PRN
  Administered 2017-01-21: 30 mg via INTRAVENOUS

## 2017-01-21 MED ORDER — SODIUM CHLORIDE 0.9 % IR SOLN
Status: DC | PRN
  Administered 2017-01-21: 3000 mL

## 2017-01-21 MED ORDER — FENTANYL CITRATE (PF) 100 MCG/2ML IJ SOLN
25.0000 ug | INTRAMUSCULAR | Status: DC | PRN
  Administered 2017-01-21 (×2): 25 ug via INTRAVENOUS
  Filled 2017-01-21: qty 1

## 2017-01-21 MED ORDER — ONDANSETRON HCL 4 MG/2ML IJ SOLN
INTRAMUSCULAR | Status: DC | PRN
  Administered 2017-01-21: 4 mg via INTRAVENOUS

## 2017-01-21 MED ORDER — DEXAMETHASONE SODIUM PHOSPHATE 10 MG/ML IJ SOLN
INTRAMUSCULAR | Status: DC | PRN
  Administered 2017-01-21: 10 mg via INTRAVENOUS

## 2017-01-21 MED ORDER — LACTATED RINGERS IV SOLN
INTRAVENOUS | Status: DC
  Administered 2017-01-21: 11:00:00 via INTRAVENOUS
  Filled 2017-01-21: qty 1000

## 2017-01-21 MED ORDER — ONDANSETRON HCL 4 MG/2ML IJ SOLN
4.0000 mg | Freq: Once | INTRAMUSCULAR | Status: DC | PRN
  Filled 2017-01-21: qty 2

## 2017-01-21 MED ORDER — LIDOCAINE 2% (20 MG/ML) 5 ML SYRINGE
INTRAMUSCULAR | Status: DC | PRN
  Administered 2017-01-21: 100 mg via INTRAVENOUS

## 2017-01-21 MED ORDER — LIDOCAINE 2% (20 MG/ML) 5 ML SYRINGE
INTRAMUSCULAR | Status: AC
  Filled 2017-01-21: qty 5

## 2017-01-21 MED ORDER — SILVER NITRATE-POT NITRATE 75-25 % EX MISC
CUTANEOUS | Status: AC
  Filled 2017-01-21: qty 1

## 2017-01-21 SURGICAL SUPPLY — 29 items
BIPOLAR CUTTING LOOP 21FR (ELECTRODE)
CATH ROBINSON RED A/P 16FR (CATHETERS) IMPLANT
CATH SILICONE 16FRX5CC (CATHETERS) ×2 IMPLANT
CLOTH BEACON ORANGE TIMEOUT ST (SAFETY) ×2 IMPLANT
COUNTER NEEDLE 1200 MAGNETIC (NEEDLE) ×2 IMPLANT
DEVICE MYOSURE LITE (MISCELLANEOUS) ×2 IMPLANT
DILATOR CANAL MILEX (MISCELLANEOUS) IMPLANT
DRSG TELFA 3X8 NADH (GAUZE/BANDAGES/DRESSINGS) IMPLANT
GLOVE BIOGEL PI IND STRL 6.5 (GLOVE) ×1 IMPLANT
GLOVE BIOGEL PI IND STRL 7.5 (GLOVE) ×1 IMPLANT
GLOVE BIOGEL PI INDICATOR 6.5 (GLOVE) ×1
GLOVE BIOGEL PI INDICATOR 7.5 (GLOVE) ×1
GLOVE INDICATOR 7.0 STRL GRN (GLOVE) ×4 IMPLANT
GLOVE SURG SS PI 6.5 STRL IVOR (GLOVE) ×2 IMPLANT
GLOVE SURG SS PI 7.0 STRL IVOR (GLOVE) ×2 IMPLANT
GOWN STRL REUS W/TWL LRG LVL3 (GOWN DISPOSABLE) ×4 IMPLANT
IV NS IRRIG 3000ML ARTHROMATIC (IV SOLUTION) ×2 IMPLANT
KIT RM TURNOVER CYSTO AR (KITS) ×2 IMPLANT
LOOP CUTTING BIPOLAR 21FR (ELECTRODE) IMPLANT
NS IRRIG 500ML POUR BTL (IV SOLUTION) ×2 IMPLANT
PACK VAGINAL MINOR WOMEN LF (CUSTOM PROCEDURE TRAY) ×2 IMPLANT
PAD OB MATERNITY 4.3X12.25 (PERSONAL CARE ITEMS) ×2 IMPLANT
PAD PREP 24X48 CUFFED NSTRL (MISCELLANEOUS) ×2 IMPLANT
SEAL ROD LENS SCOPE MYOSURE (ABLATOR) ×2 IMPLANT
SURGILUBE 2OZ TUBE FLIPTOP (MISCELLANEOUS) ×2 IMPLANT
TOWEL OR 17X24 6PK STRL BLUE (TOWEL DISPOSABLE) ×2 IMPLANT
TUBING AQUILEX INFLOW (TUBING) ×2 IMPLANT
TUBING AQUILEX OUTFLOW (TUBING) ×2 IMPLANT
WATER STERILE IRR 500ML POUR (IV SOLUTION) IMPLANT

## 2017-01-21 NOTE — Anesthesia Preprocedure Evaluation (Addendum)
Anesthesia Evaluation  Patient identified by MRN, date of birth, ID band Patient awake    Reviewed: Allergy & Precautions, NPO status , Patient's Chart, lab work & pertinent test results  Airway Mallampati: II  TM Distance: >3 FB Neck ROM: Full    Dental  (+) Teeth Intact   Pulmonary former smoker,    breath sounds clear to auscultation       Cardiovascular hypertension,  Rhythm:Regular Rate:Normal     Neuro/Psych    GI/Hepatic   Endo/Other    Renal/GU      Musculoskeletal   Abdominal (+) + obese,   Peds  Hematology   Anesthesia Other Findings   Reproductive/Obstetrics                            Anesthesia Physical Anesthesia Plan  ASA: II  Anesthesia Plan: General   Post-op Pain Management:    Induction: Intravenous  PONV Risk Score and Plan: Ondansetron and Dexamethasone  Airway Management Planned: LMA  Additional Equipment:   Intra-op Plan:   Post-operative Plan:   Informed Consent: I have reviewed the patients History and Physical, chart, labs and discussed the procedure including the risks, benefits and alternatives for the proposed anesthesia with the patient or authorized representative who has indicated his/her understanding and acceptance.   Dental advisory given  Plan Discussed with: CRNA and Anesthesiologist  Anesthesia Plan Comments:         Anesthesia Quick Evaluation

## 2017-01-21 NOTE — Anesthesia Procedure Notes (Signed)
Procedure Name: LMA Insertion Date/Time: 01/21/2017 12:43 PM Performed by: Maris BergerENENNY, Jamie Vaughn Pre-anesthesia Checklist: Patient identified, Emergency Drugs available, Suction available and Patient being monitored Patient Re-evaluated:Patient Re-evaluated prior to induction Oxygen Delivery Method: Circle system utilized Preoxygenation: Pre-oxygenation with 100% oxygen Induction Type: IV induction Ventilation: Mask ventilation without difficulty LMA: LMA inserted LMA Size: 5.0 Number of attempts: 1 Airway Equipment and Method: Bite block Placement Confirmation: positive ETCO2 Tube secured with: Tape Dental Injury: Teeth and Oropharynx as per pre-operative assessment

## 2017-01-21 NOTE — H&P (Signed)
History of Present Illness  General:  Pt referred for evaluation of PMB.  Pt had scant bleeding in April whiel she was on vacation. The next day it was a little more red and stopped. Denies pain. This is the first episode of PMB. Ultrasound on 6/18 showed normal uterus EMS 0.8 cm. Normal ovaries.   Current Medications  Taking   Lisinopril 10 MG Tablet 1 tablet Orally Once a day, Notes: MCNEILL   Rosuvastatin Calcium 5 MG Tablet 1 tablet Orally Once a day, Notes: MCNEILL   OTC tablet picolinate 800mcg 1 tablet once daily oral once daily, Notes: OTC   Eye Vitamins Capsule 1 capsule by mouth once a day, Notes: OTC   Miralax suspension 17 grams by mouth once a day at bedtime as needed, Notes: OTC   Prilosec OTC(Omeprazole Magnesium) 20 MG Tablet Delayed Release 1 tablet Orally Once a day, Notes: OTC   Vitamin D3 2000 UNIT Capsule 2 capsules by mouth once a day, Notes: OTC   Vitamin C 500 MG Capsule Orally , Notes: OTC   Co Q-10 300 MG Capsule 2 capsules Orally Once a day, Notes: OTC   OTC Curamed once a day, Notes: OTC   Alprazolam 1 MG Tablet 1 tablet Orally at bedtime, Notes: MCNEILL   Naproxen 500 MG Tablet 1 tablet Orally Twice a day as needed, Notes: MCNEILL   Anusol-HC(Hydrocortisone Acetate) 25 MG Suppository 1 suppository Rectal twice a day   Medication List reviewed and reconciled with the patient    Past Medical History  Hyperlipidemia.   Chronic Sleep Disturbance.   Postmenopausal - LMP 08/2010.   GERD.   Herpes Simplex without complications.   Anal fissure.   Unspecified constipation.   Asymptomatic varicose veins.   Impaired Fasting Glucose.   Elevated BP without diagnosis of hypertension.   [place] DMHDDSAS reviewed 11/08/16.           Surgical History  Hemorrhoidectomy 2006  colon 07/08/03  D&C 1983   Family History  Father: alive 4877 yrs, melanoma, prostate cancer, diagnosed with Prostate CA  Mother: deceased 2571 yrs, metastatic cancer to her brain, not  certain of source - has had breast, lung, and thyroid cancer. Non smoker, diagnosed with Lung Cancer, Breast cancer  Brother 1: A + W  Neg GI hx for colon cancer, colon polyps, or liver disease.   Social History  General:  Tobacco use  cigarettes: Former smoker Quit in year quit date 02/2011 smoked for 30 years Tobacco history last updated 01/09/2017 no EXPOSURE TO PASSIVE SMOKE.  Alcohol: yes, occasionally.  Caffeine: yes.  no Recreational drug use.  no DIET, Single - mostly cooks at home, eats salads when eating out.  Exercise: yes, walks property at work several times a day, but nothing formal - gets 10,000 steps on weekdays, 15,000 steps on the weekend.  Marital Status: single.  OCCUPATION: employed, Investment banker, corporateproperty manager.    Gyn History  Sexual activity not currently sexually active.  Periods : postmenopausal.  LMP Had spotting x 2 days in April 2018 and nothing since.  Denies H/O Birth control.  Last pap smear date 11/09/2013 - normal, no HPV.  Last mammogram date 12/17/16.  Denies H/O Abnormal pap smear.  Denies H/O STD.    OB History  Never been pregnant per patient.    Allergies  LATEX: swelling: Allergy   Hospitalization/Major Diagnostic Procedure  D & C   hemorrhoidectomy 2006   Review of Systems  See scanned ROS form for detail  See HPI.   Vital Signs  Wt 240, Wt change -.6 lb, Ht 65.25, BMI 39.63, Pulse sitting 75, BP sitting 153/87.   Physical Examination  GENERAL:  Patient appears alert and oriented.  General Appearance: well-appearing, well-developed, no acute distress.  Speech: clear.  LUNGS:  Effort: no respiratory distress, comfortable breathing.  ABDOMEN:  General: no masses tenderness or organomegaly, non distended.  FEMALE GENITOURINARY:  Cervix visualized, healthy appearing, no discharge, no lesions.  Adnexa: no mass, non tender.  Uterus: normal size/shape/consistency, freely mobile, non tender.  Vagina: pink/moist mucosa, no lesions, no  abnormal discharge.  Vulva: normal, no lesions, no skin discoloration.  Anus: no external hemosrhoids.  EXTREMITIES:  general no edema.     Assessments   1. Postmenopausal bleeding - N95.0 (Primary)   2. Thickened endometrium - R93.8   Treatment  1. Postmenopausal bleeding  Procedure: GYN ENDOMETRIAL BIOPSY Notes: Inadequate EMB. Recommend Hyst D&C unless malignancy noted on EMB.    Procedures  Endometrial Biopsy:  Consent Informed consent obtained. UPT negative.  Prep cervix was prepped with betadine.  Procedure uterus was sounded to 4 cm. a 4 mm pipet was advanced with difficulty-felt like I was in the endocervical canal, with scant return of tissue, mostly mucus, 2 passes.  Post procedure Patient tolerated procedure well.  Indication abnormal uterine bleeding.

## 2017-01-21 NOTE — Transfer of Care (Signed)
Immediate Anesthesia Transfer of Care Note  Patient: Jamie Vaughn  Procedure(s) Performed: Procedure(s): DILATATION AND CURETTAGE /HYSTEROSCOPY/ myosure / polypectomy (N/A)  Patient Location: PACU  Anesthesia Type:General  Level of Consciousness: awake and oriented  Airway & Oxygen Therapy: Patient Spontanous Breathing and Patient connected to nasal cannula oxygen  Post-op Assessment: Report given to RN  Post vital signs: reviewed, remains hypertensive, plan to work in Hydralazine  Last Vitals: 196/107, 68, 13, 100%, 97.5 Vitals:   01/21/17 1027  BP: (!) 154/79  Pulse: 65  Resp: 19  Temp: 36.8 C    Last Pain:  Vitals:   01/21/17 1057  TempSrc:   PainSc: 1       Patients Stated Pain Goal: 5 (01/21/17 1057)  Complications: No apparent anesthesia complications

## 2017-01-21 NOTE — Interval H&P Note (Signed)
History and Physical Interval Note:  01/21/2017 12:34 PM  Jamie Vaughn  has presented today for surgery, with the diagnosis of post menopausal bleeding  The various methods of treatment have been discussed with the patient and family. After consideration of risks, benefits and other options for treatment, the patient has consented to  Procedure(s): DILATATION AND CURETTAGE /HYSTEROSCOPY (N/A) as a surgical intervention .  The patient's history has been reviewed, patient examined, no change in status, stable for surgery.  I have reviewed the patient's chart and labs.  Questions were answered to the patient's satisfaction.     Dion BodyVARNADO, Everley Evora

## 2017-01-21 NOTE — Brief Op Note (Signed)
01/21/2017  1:22 PM  PATIENT:  Jamie Vaughn  61 y.o. female  PRE-OPERATIVE DIAGNOSIS:  post menopausal bleeding  POST-OPERATIVE DIAGNOSIS:  post menopausal bleeding, endometrial polyps  PROCEDURE:  Procedure(s): DILATATION AND CURETTAGE /HYSTEROSCOPY/ myosure / polypectomy (N/A)  SURGEON:  Surgeon(s) and Role:    Geryl Rankins* Jayant Kriz, MD - Primary  PHYSICIAN ASSISTANT:   ASSISTANTS: none   ANESTHESIA:   local and general  EBL:  Total I/O In: 600 [I.V.:600] Out: 10 [Blood:10] Fluid deficit 245  BLOOD ADMINISTERED:none  DRAINS: none   LOCAL MEDICATIONS USED:  2 %LIDOCAINE  and Amount: 10 ml  SPECIMEN:  Source of Specimen:  endometrial polyps and currettings  DISPOSITION OF SPECIMEN:  PATHOLOGY  COUNTS:  YES  TOURNIQUET:  * No tourniquets in log *  DICTATION: .Other Dictation: Dictation Number [id number]  PLAN OF CARE: Discharge to home after PACU  PATIENT DISPOSITION:  PACU - hemodynamically stable.   Delay start of Pharmacological VTE agent (>24hrs) due to surgical blood loss or risk of bleeding: yes

## 2017-01-21 NOTE — Discharge Instructions (Addendum)
Dilation and Curettage or Vacuum Curettage, Care After °This sheet gives you information about how to care for yourself after your procedure. Your health care provider may also give you more specific instructions. If you have problems or questions, contact your health care provider. °What can I expect after the procedure? °After your procedure, it is common to have: °· Mild pain or cramping. °· Some vaginal bleeding or spotting. ° °These may last for up to 2 weeks after your procedure. °Follow these instructions at home: °Activity ° °· Do not drive or use heavy machinery while taking prescription pain medicine. °· Avoid driving for the first 24 hours after your procedure. °· Take frequent, short walks, followed by rest periods, throughout the day. Ask your health care provider what activities are safe for you. After 1-2 days, you may be able to return to your normal activities. °· Do not lift anything heavier than 10 lb (4.5 kg) until your health care provider approves. °· For at least 2 weeks, or as long as told by your health care provider, do not: °? Douche. °? Use tampons. °? Have sexual intercourse. °General instructions ° °· Take over-the-counter and prescription medicines only as told by your health care provider. This is especially important if you take blood thinning medicine. °· Do not take baths, swim, or use a hot tub until your health care provider approves. Take showers instead of baths. °· Wear compression stockings as told by your health care provider. These stockings help to prevent blood clots and reduce swelling in your legs. °· It is your responsibility to get the results of your procedure. Ask your health care provider, or the department performing the procedure, when your results will be ready. °· Keep all follow-up visits as told by your health care provider. This is important. °Contact a health care provider if: °· You have severe cramps that get worse or that do not get better with  medicine. °· You have severe abdominal pain. °· You cannot drink fluids without vomiting. °· You develop pain in a different area of your pelvis. °· You have bad-smelling vaginal discharge. °· You have a rash. °Get help right away if: °· You have vaginal bleeding that soaks more than one sanitary pad in 1 hour, for 2 hours in a row. °· You pass large blood clots from your vagina. °· You have a fever that is above 100.4°F (38.0°C). °· Your abdomen feels very tender or hard. °· You have chest pain. °· You have shortness of breath. °· You cough up blood. °· You feel dizzy or light-headed. °· You faint. °· You have pain in your neck or shoulder area. °This information is not intended to replace advice given to you by your health care provider. Make sure you discuss any questions you have with your health care provider. °Document Released: 06/07/2000 Document Revised: 02/07/2016 Document Reviewed: 01/11/2016 °Elsevier Interactive Patient Education © 2018 [employer] ° ° °Post Anesthesia Home Care Instructions ° °Activity: °Get plenty of rest for the remainder of the day. A responsible individual must stay with you for 24 hours following the procedure.  °For the next 24 hours, DO NOT: °-Drive a car °-Operate machinery °-Drink alcoholic beverages °-Take any medication unless instructed by your physician °-Make any legal decisions or sign important papers. ° °Meals: °Start with liquid foods such as gelatin or soup. Progress to regular foods as tolerated. Avoid greasy, spicy, heavy foods. If nausea and/or vomiting occur, drink only clear liquids until the nausea   and/or vomiting subsides. Call your physician if vomiting continues. ° °Special Instructions/Symptoms: °Your throat may feel dry or sore from the anesthesia or the breathing tube placed in your throat during surgery. If this causes discomfort, gargle with warm salt water. The discomfort should disappear within 24 hours. ° °If you had a scopolamine patch placed  behind your ear for the management of post- operative nausea and/or vomiting: ° °1. The medication in the patch is effective for 72 hours, after which it should be removed.  Wrap patch in a tissue and discard in the trash. Wash hands thoroughly with soap and water. °2. You may remove the patch earlier than 72 hours if you experience unpleasant side effects which may include dry mouth, dizziness or visual disturbances. °3. Avoid touching the patch. Wash your hands with soap and water after contact with the patch. °  ° ° °

## 2017-01-22 ENCOUNTER — Encounter (HOSPITAL_BASED_OUTPATIENT_CLINIC_OR_DEPARTMENT_OTHER): Payer: Self-pay | Admitting: Obstetrics and Gynecology

## 2017-01-22 NOTE — Op Note (Signed)
NAMBayard Hugger:  Levan, Bradie                 ACCOUNT NO.:  [id number]  MEDICAL RECORD NO.:  [id number]  LOCATION:                                FACILITY:  [facility]  PHYSICIAN:  Pieter PartridgeEvelyn B Tandy Lewin, MD   DATE OF BIRTH:  1955/10/27  DATE OF PROCEDURE:  01/21/2017 DATE OF DISCHARGE:  01/21/2017                              OPERATIVE REPORT   PREOPERATIVE DIAGNOSIS:  Postmenopausal bleeding.  POSTOPERATIVE DIAGNOSES:  Postmenopausal bleeding and endometrial polyps.  PROCEDURES:  Hysteroscopy, D and C with MyoSure polypectomy.  SURGEON:  Pieter PartridgeEvelyn B Troyce Febo, MD  ASSISTANT:  None.  ANESTHESIA:  Local and general.  Local was 2% lidocaine in 10 mL.  ESTIMATED BLOOD LOSS:  10.  FLUID DEFICIT:  340.  BLOOD ADMINISTERED:  None.  SPECIMEN:  Endometrial polyps and curettings.  Disposition of specimen to Pathology.  COUNTS:  Correct.  PATIENT DISPOSITION:  To PACU, hemodynamically stable.  COMPLICATIONS:  None.  FINDINGS:  Two endometrial polyps, the first on the lower uterine segment and the other in the left cornu.  Atrophic-appearing endometrium.  Vaginal mucosa atrophic.  DESCRIPTION OF PROCEDURE:  Ms. Lonni FixCoombs was identified in the holding area.  She was then taken to the operating room with IV running.  She was placed in dorsal lithotomy position and underwent general endotracheal anesthesia without complication.  She was then prepped and draped in a normal sterile fashion.  I and O of bladder was performed. A time-out was taken.  SCDs were on her legs and operating.  Graves speculum was inserted into the vagina.  Of note, the cervix appeared to still be healing from previous attempt of endometrial biopsy in the office.  There were some inflamed puncture sites from the tenaculum.  No active bleeding was noted.  The os binder was then used and I felt some resistance upon entering the internal os and that was the polyp.  After I was able to put in the hysteroscope and evaluate, I then  advanced the hysteroscope to reveal the findings above.  At that point, I decided to do the MyoSure and the smallest blade was used.  The MyoSure was then advanced to the uterine fundus.  The polyps were removed very easily.  The one in the left cornua, there was a small fragment that could not be reached with the device, but __________ particularly at the end of the procedure.  I did do some brief curettage with the MyoSure.  I then removed the MyoSure and then put a serrated curette in to do a gentle curettage of all 4 quadrants.  Tenaculum site was made hemostatic with silver nitrate.  No active bleeding noted from the cervix.  All instruments were removed from the vagina.  There were some abrasions on the both vaginal sidewalls, so I put in a little lubricant at the end of the procedure.     Pieter PartridgeEvelyn B Theresa Wedel, MD     EBV/MEDQ  D:  01/21/2017  T:  01/21/2017  Job:  [id number]

## 2017-01-22 NOTE — Anesthesia Postprocedure Evaluation (Signed)
Anesthesia Post Note  Patient: Atalia A Bogdanski  Procedure(s) Performed: Procedure(s) (LRB): DILATATION AND CURETTAGE /HYSTEROSCOPY/ myosure / polypectomy (N/A)     Patient location during evaluation: PACU Anesthesia Type: General Level of consciousness: awake, awake and alert and oriented Pain management: pain level controlled Vital Signs Assessment: post-procedure vital signs reviewed and stable Respiratory status: spontaneous breathing, nonlabored ventilation and respiratory function stable Cardiovascular status: blood pressure returned to baseline Postop Assessment: no headache Anesthetic complications: no    Last Vitals:  Vitals:   01/21/17 1430 01/21/17 1508  BP: (!) 171/88 (!) 170/97  Pulse: 66 66  Resp: 17 16  Temp:  36.9 C    Last Pain:  Vitals:   01/22/17 1132  TempSrc:   PainSc: 0-No pain                 Racquel Arkin COKER

## 2017-05-16 IMAGING — MG 2D DIGITAL SCREENING BILATERAL MAMMOGRAM WITH CAD AND ADJUNCT TO
8 of 13 series · 8 of 29 positions shown · non-contrast
Comparison: Previous exam(s).

CLINICAL DATA: Screening.

EXAM:
2D DIGITAL SCREENING BILATERAL MAMMOGRAM WITH CAD AND ADJUNCT TOMO

[R CV]
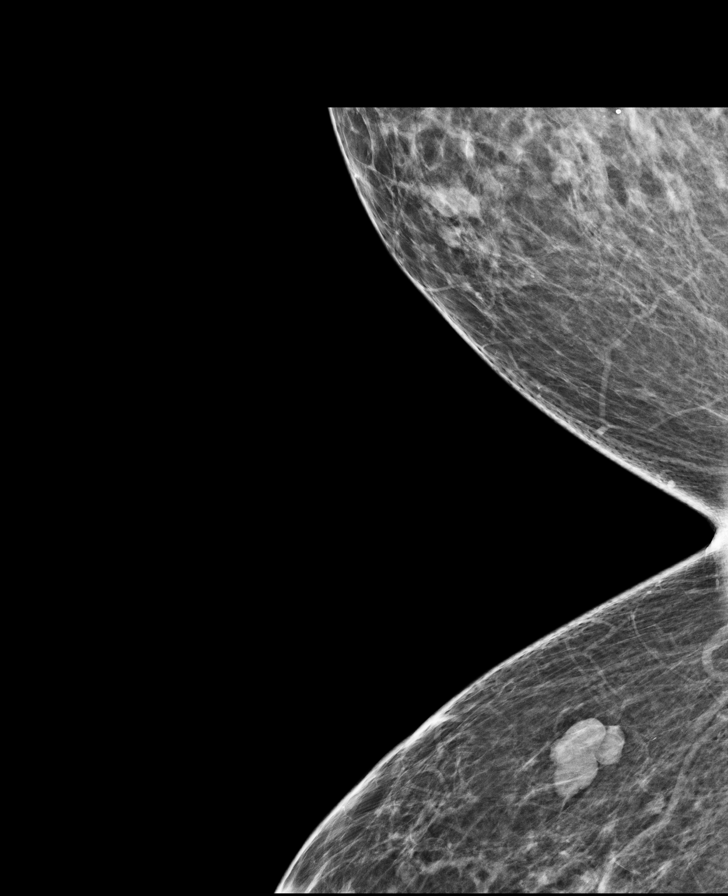

[R CC]
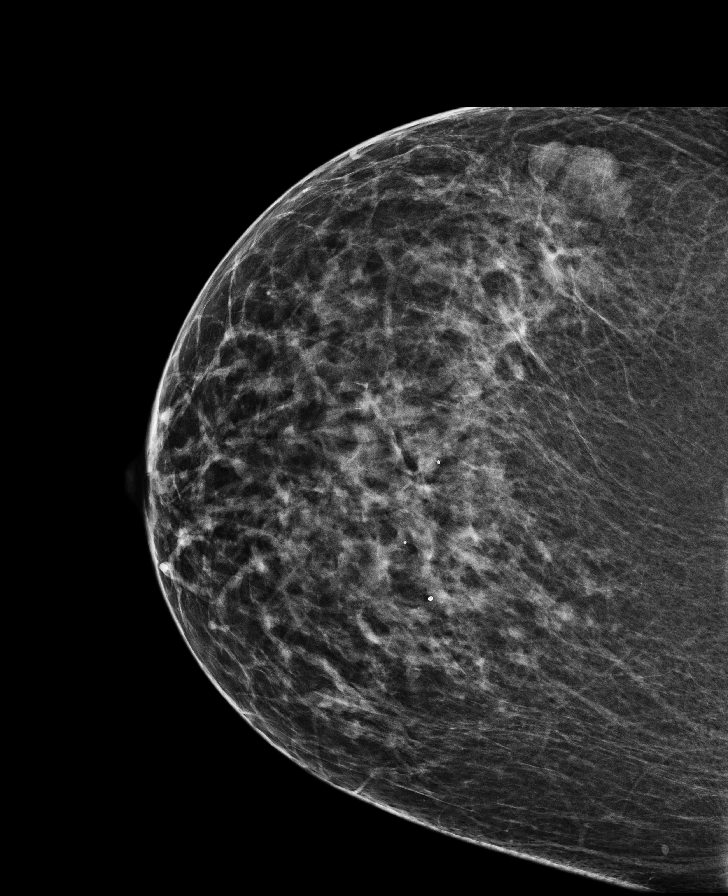

[L MLO synth-2D]
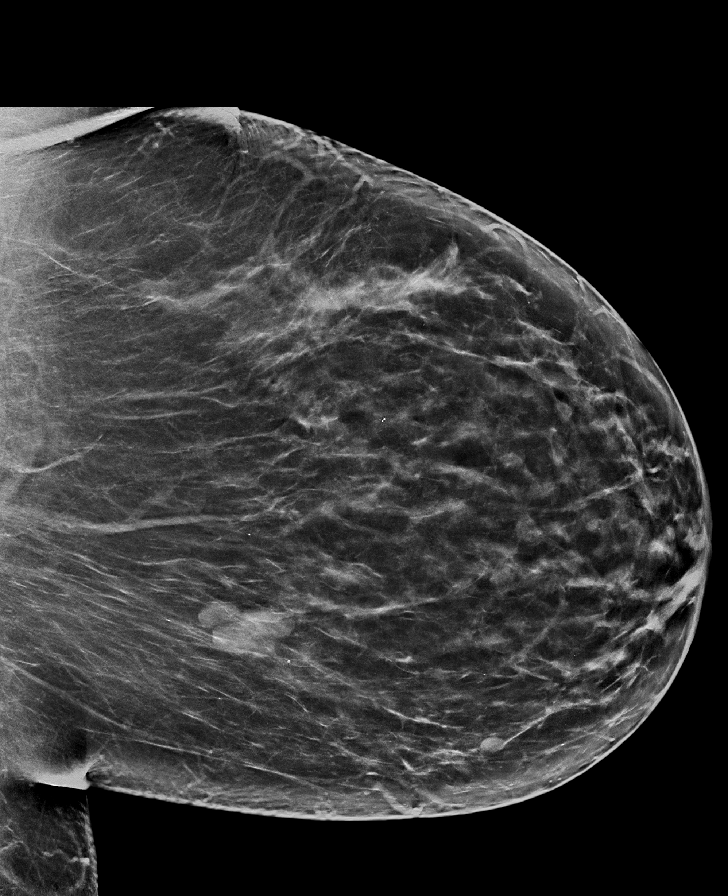

[R MLO synth-2D]
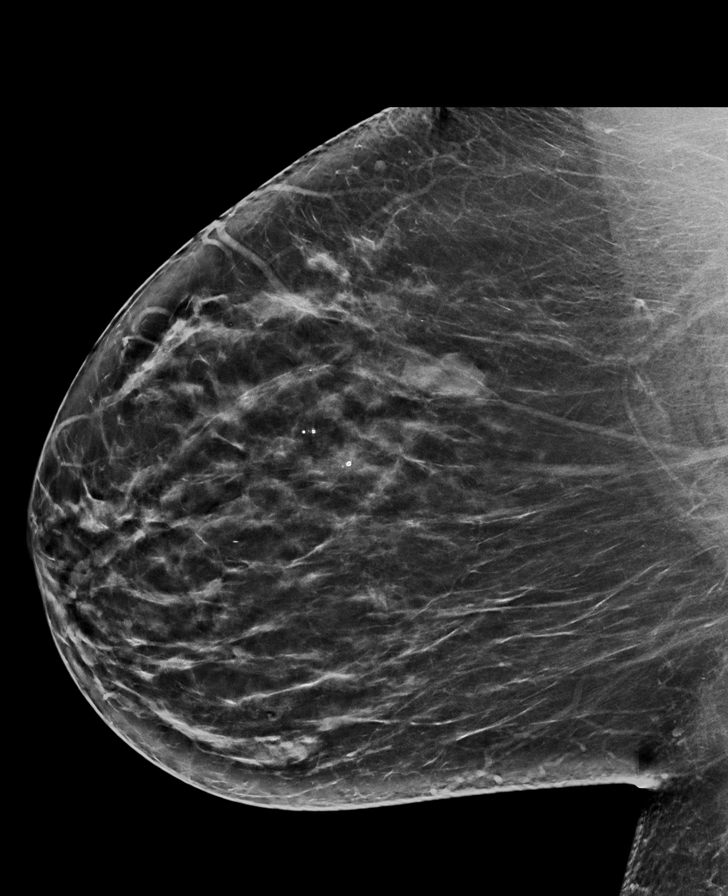

[L CC synth-2D]
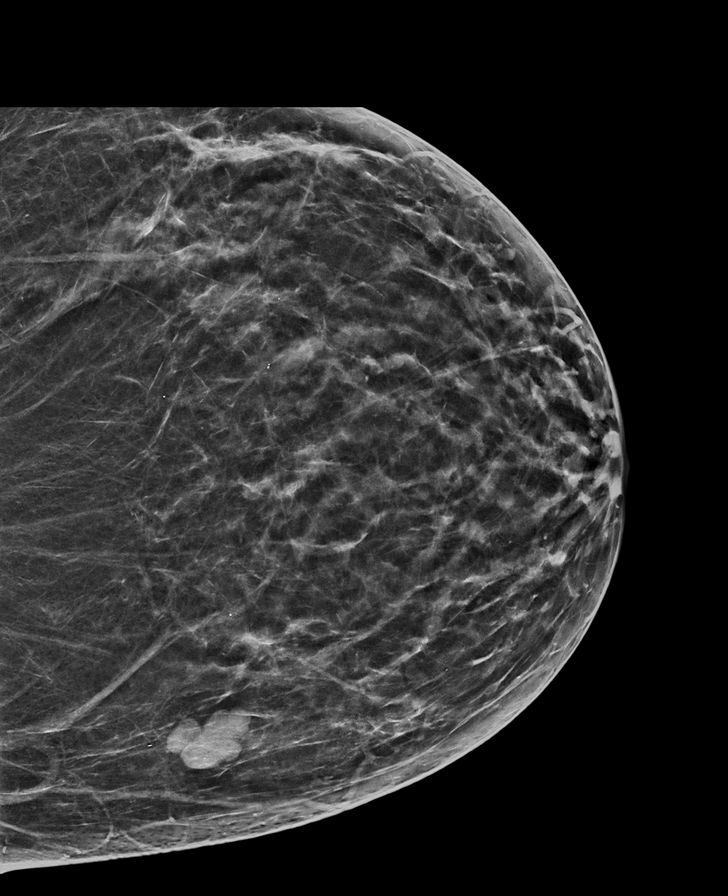

[R CC synth-2D]
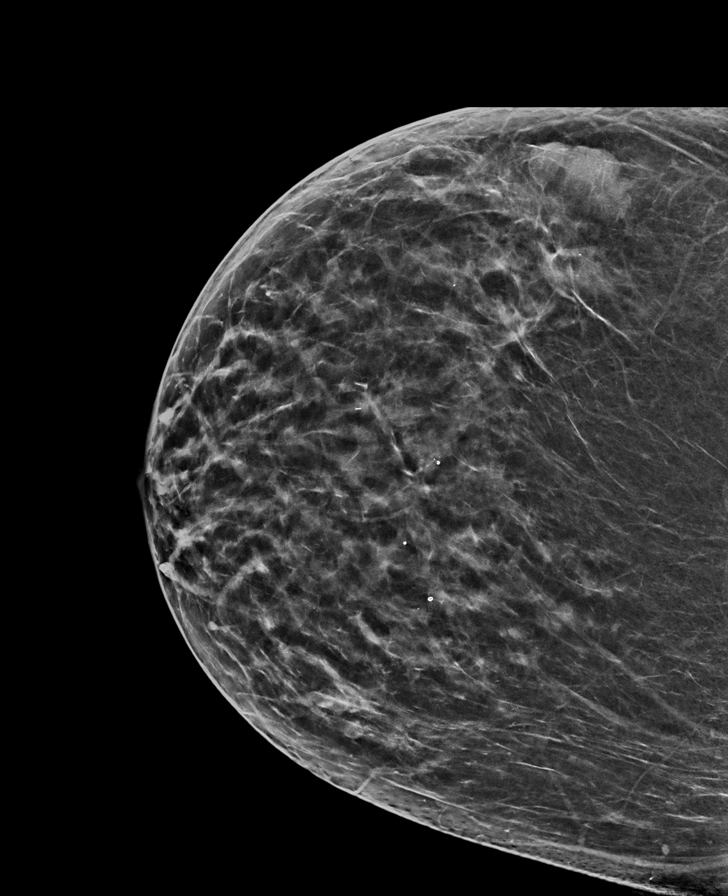

[R MLO]
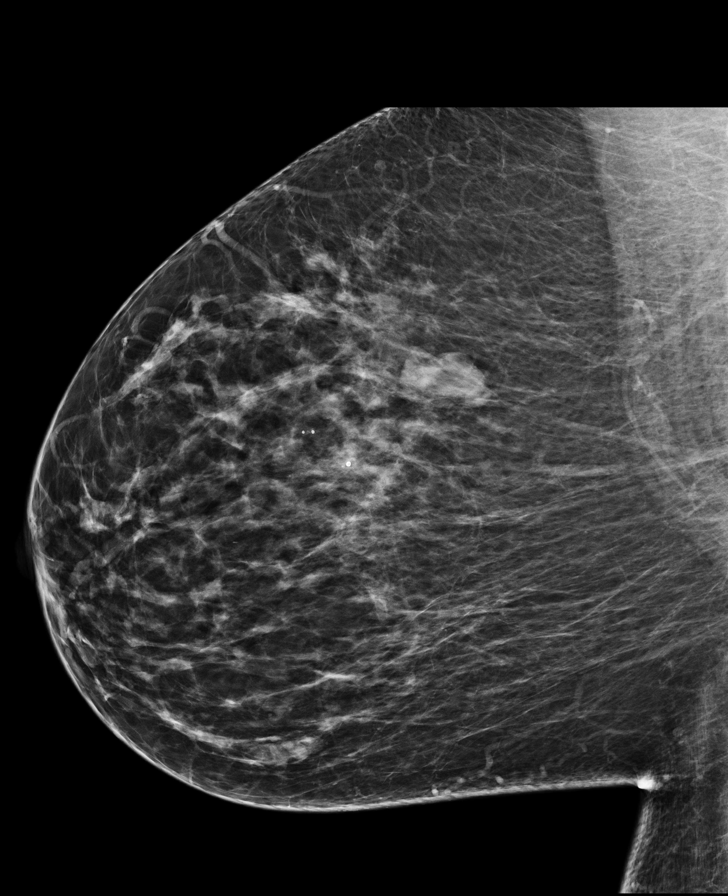

[L CC]
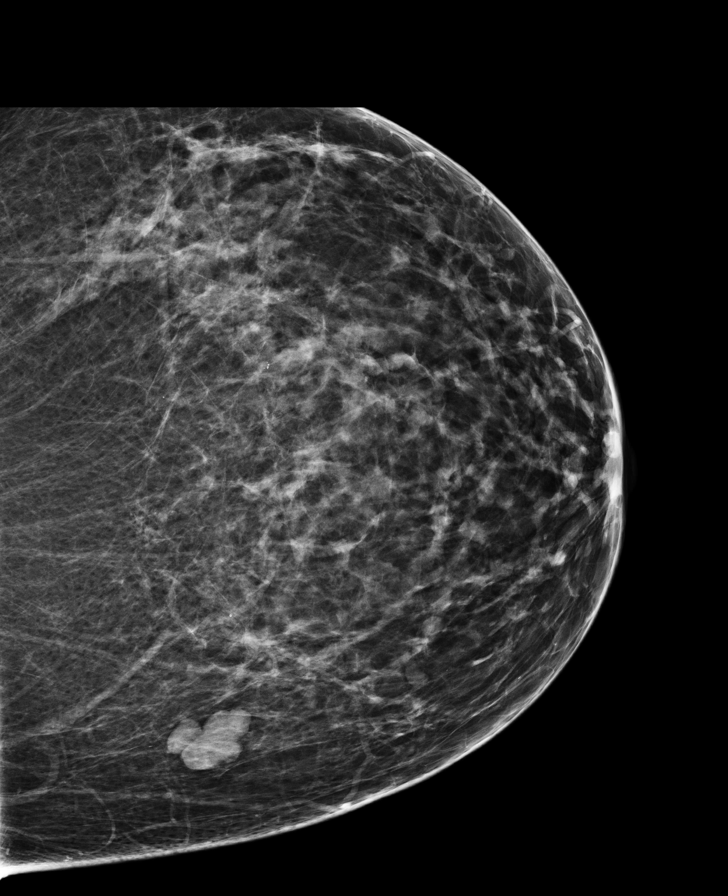

[8 of 29 positions shown; findings below may reference images not displayed]

ACR Breast Density Category c: The breast tissue is heterogeneously
dense, which may obscure small masses.
FINDINGS: There are no findings suspicious for malignancy. Images were
processed with CAD.
IMPRESSION: No mammographic evidence of malignancy. A result letter of this
screening mammogram will be mailed directly to the patient.

RECOMMENDATION:
Screening mammogram in one year. (Code:TN-[id number])

BI-RADS CATEGORY  1: Negative.

## 2017-06-11 ENCOUNTER — Ambulatory Visit (INDEPENDENT_AMBULATORY_CARE_PROVIDER_SITE_OTHER): Payer: BLUE CROSS/BLUE SHIELD | Admitting: Cardiology

## 2017-06-11 ENCOUNTER — Encounter: Payer: Self-pay | Admitting: Cardiology

## 2017-06-11 VITALS — BP 150/86 | HR 71 | Resp 16 | Ht 66.5 in | Wt 241.6 lb

## 2017-06-11 DIAGNOSIS — I119 Hypertensive heart disease without heart failure: Secondary | ICD-10-CM | POA: Diagnosis not present

## 2017-06-11 DIAGNOSIS — E782 Mixed hyperlipidemia: Secondary | ICD-10-CM | POA: Insufficient documentation

## 2017-06-11 DIAGNOSIS — R072 Precordial pain: Secondary | ICD-10-CM

## 2017-06-11 MED ORDER — LISINOPRIL 20 MG PO TABS: 20.0000 mg | ORAL_TABLET | Freq: Every day | ORAL | 3 refills | Status: DC

## 2017-06-11 NOTE — Progress Notes (Signed)
Cardiology Office Note    Date:  06/11/2017   ID:  Jamie Vaughn, DOB 06/16/1956, MRN [id number]  PCP:  Gweneth DimitriMcNeill, Wendy, MD  Cardiologist:   Tobias AlexanderKatarina Kionte Baumgardner, MD  Referring physician: Gweneth DimitriMCNEILL,WENDY, MD  Chief complain: Palpitations  History of Present Illness:  Jamie Vaughn is a 61 y.o. female with prior medical history of obesity, hyperlipidemia, was coming for concern of palpitations. The patient states that she has been through very stressful situation with her father being diagnosed with progressive posttreatment cancer. She has noticed the palpitation first 6 months ago when she visited her father and getting file with her brother. The patient states that these have been every weekend. She wakes up at night with feeling of very strong and very fast heartbeat and a cane last several hours and can be associated with chest tightness but no dizziness, no syncope no shortness of breath. She never experiences her palpitations during the day and very occasionally at home when she is not listing father. She walks 4 times a week for 1 hour and doesn't experience any chest pain or shortness of breath with that. She has experienced chest pain on emotional stress that felt like pressure radiating to her left arm. She is compliant with her Crestor. And has no side effects. There is no family history of premature coronary artery disease, heart failure or sudden cardiac death. She denies any lower extremity edema, orthopnea or proximal nocturnal dyspnea.  Labs are normal creatinine in electrolytes normal LFTs, triglycerides 114, HDL 49, LDL 99 these were collected on 11/15/2015.  02/28/2016 - this is a 3 months follow-up, the patient underwent stress echocardiogram that showed no ischemia and normal exercise tolerance, however her blood pressure increased to 230 at peak exercise. Her lisinopril was increased to 10 mg daily and she was advised to start regular exercise. She is tolerating Crestor well. She  states that now knowing that her stress test is normal she feels overall much better. Denies any palpitations or syncope. No lower extremity edema orthopnea or proximal nocturnal dyspnea.  06/11/2017 - is one year follow-up, the patient feels and looks great, she has any chest pain or shortness of breath dyspnea on exertion, chest pain. She states that she never has lower extremity edema, orthopnea, paroxysmal nocturnal dyspnea, she is no palpitations or syncope. She thinks compliant with her medications. Her blood pressure at home usually runs around 140/80. Her cholesterol is being followed by her primary care physician, it was excellent so her crestor was decreased to 5 mg daily.  Past Medical History:  Diagnosis Date  . Asymptomatic varicose veins   . Chronic constipation   . GERD (gastroesophageal reflux disease)   . Hyperlipidemia   . Hypertension   . Postmenopausal 08/2010  . Pre-diabetes   . Wears glasses    Past Surgical History:  Procedure Laterality Date  . DILATION AND CURETTAGE OF UTERUS  1983  . DOBUTAMINE STRESS ECHO  12-21-2015 dr Aris Lotkatarina Genny Caulder   negative -- no evidence of ischemia  . EUA/  BANDING 3 INTERNAL HEMORRHROIDS  11-23-2004  dr Carolynne Edouardtoth  . HYSTEROSCOPY W/D&C N/A 01/21/2017   Procedure: DILATATION AND CURETTAGE /HYSTEROSCOPY/ myosure / polypectomy;  Surgeon: Geryl RankinsVarnado, Evelyn, MD;  [facility];  Service: Gynecology;  Laterality: N/A;  . TRANSTHORACIC ECHOCARDIOGRAM  12/21/2015   mild LVH, ef 60-65%/  trivial MR and TR/  mild LAE   Current Medications: Outpatient Medications Prior to Visit  Medication Sig Dispense Refill  .  ALPRAZolam (XANAX) 1 MG tablet Take 1 mg by mouth at bedtime as needed for anxiety.    . Cholecalciferol (VITAMIN D3) 2000 units capsule Take 4,000 Units by mouth daily.     . Chromium Picolinate 800 MCG TABS Take 1 tablet by mouth daily.    . Cobalamine Combinations (B-12) 1000-400 MCG SUBL Place under the tongue.    .  Coenzyme Q10 300 MG CAPS Take 2 capsules by mouth daily.    Marland Kitchen ibuprofen (ADVIL,MOTRIN) 600 MG tablet Take 1 tablet (600 mg total) by mouth every 6 (six) hours as needed. 20 tablet 0  . lisinopril (PRINIVIL,ZESTRIL) 10 MG tablet Take 1 tablet (10 mg total) by mouth daily. (Patient taking differently: Take 10 mg by mouth every evening. ) 90 tablet 3  . Multiple Vitamins-Minerals (CENTRUM SILVER ADULT 50+) TABS Take 1 tablet by mouth daily.    . Multiple Vitamins-Minerals (EYE VITAMINS) CAPS Take 1 capsule by mouth daily.    . naproxen (NAPROSYN) 500 MG tablet Take 500 mg by mouth 2 (two) times daily as needed.     Marland Kitchen omeprazole (PRILOSEC OTC) 20 MG tablet Take 20 mg by mouth every morning.     . polyethylene glycol (MIRALAX / GLYCOLAX) packet Take 17 g by mouth every morning.     . rosuvastatin (CRESTOR) 20 MG tablet Take 20 mg by mouth every evening.     . vitamin C (ASCORBIC ACID) 500 MG tablet Take 500 mg by mouth daily.     No facility-administered medications prior to visit.      Allergies:   Latex   Social History   Socioeconomic History  . Marital status: Single    Spouse name: None  . Number of children: None  . Years of education: None  . Highest education level: None  Social Needs  . Financial resource strain: None  . Food insecurity - worry: None  . Food insecurity - inability: None  . Transportation needs - medical: None  . Transportation needs - non-medical: None  Occupational History  . Occupation: PROPERTY MANAGER  Tobacco Use  . Smoking status: Former Smoker    Years: 30.00    Types: Cigarettes    Last attempt to quit: 03/23/2009    Years since quitting: 8.2  . Smokeless tobacco: Never Used  Substance and Sexual Activity  . Alcohol use: Yes    Alcohol/week: 4.2 oz    Types: 7 Glasses of wine per week    Comment: daily wine  . Drug use: No  . Sexual activity: None  Other Topics Concern  . None  Social History Narrative  . None     Family History:  The  patient's family history includes Breast cancer in her mother; Lung cancer in her mother; Melanoma in her father; Prostate cancer in her father; Thyroid cancer in her mother.   ROS:   Please see the history of present illness.    ROS All other systems reviewed and are negative.   PHYSICAL EXAM:   VS:  BP (!) 150/86   Pulse 71   Resp 16   Ht 5' 6.5" (1.689 m)   Wt 241 lb 9.6 oz (109.6 kg)   LMP 12/12/2011   SpO2 96%   BMI 38.41 kg/m    GEN: Well nourished, well developed, in no acute distress  HEENT: normal  Neck: no JVD, carotid bruits, or masses Cardiac: RRR; no murmurs, rubs, or gallops,no edema  Respiratory:  clear to auscultation bilaterally, normal work  of breathing GI: soft, nontender, nondistended, + BS MS: no deformity or atrophy  Skin: warm and dry, no rash Neuro:  Alert and Oriented x 3, Strength and sensation are intact Psych: euthymic mood, full affect  Wt Readings from Last 3 Encounters:  06/11/17 241 lb 9.6 oz (109.6 kg)  01/21/17 235 lb 8 oz (106.8 kg)  02/28/16 239 lb (108.4 kg)    Studies/Labs Reviewed:   EKG:  EKG is ordered today.  The ekg ordered today demonstrates Normal sinus rhythm and normal EKG.ent Labs: 01/21/2017: Hemoglobin 14.3; Potassium 4.0; Sodium 141   Lipid Panel No results found for: CHOL, TRIG, HDL, CHOLHDL, VLDL, LDLCALC, LDLDIRECT  Additional studies/ records that were reviewed today include:  Records reviewed from her PCP as described in history of present illness.    ASSESSMENT:    No diagnosis found.  PLAN:  In order of problems listed above:  1. Hypertensive heart disease without heart failure, echocardiogram showed normal LVEF is greater than diastolic dysfunction and mild concentric LVH stress test followed blood pressure elevation on exertion to 230 mmHg. we will increase lisinopril from 10-20 mg daily as she remains hypertensive. 2. Hyperlipidemia currently on Crestor 5 mg daily that she is well tolerated, will obtain  labs from her primary care physician.  3. Chest pain most probably caused by severe hypertensive response on exertion, dizziness has improved. She is encouraged to start exercise regimen again.  Medication Adjustments/Labs and Tests Ordered: Current medicines are reviewed at length with the patient today.  Concerns regarding medicines are outlined above.  Medication changes, Labs and Tests ordered today are listed in the Patient Instructions below. There are no Patient Instructions on file for this visit.   Signed, Tobias AlexanderKatarina Antoine Fiallos, MD  06/11/2017 12:02 PM    [facility] [address], [place], [place]  [address] Phone: [phone]; Fax: [phone]

## 2017-06-11 NOTE — Patient Instructions (Signed)
Medication Instructions:   INCREASE YOUR LISINOPRIL TO 20 MG ONCE DAILY     Follow-Up:  Your physician wants you to follow-up in: ONE YEAR WITH DR Johnell ComingsNELSON You will receive a reminder letter in the mail two months in advance. If you don't receive a letter, please call our office to schedule the follow-up appointment.        If you need a refill on your cardiac medications before your next appointment, please call your pharmacy.

## 2017-07-09 ENCOUNTER — Ambulatory Visit: Payer: BLUE CROSS/BLUE SHIELD | Admitting: Cardiology

## 2017-07-10 ENCOUNTER — Ambulatory Visit: Payer: BLUE CROSS/BLUE SHIELD | Admitting: Cardiology

## 2017-10-13 ENCOUNTER — Other Ambulatory Visit: Payer: Self-pay

## 2017-10-13 MED ORDER — LISINOPRIL 20 MG PO TABS: 20.0000 mg | ORAL_TABLET | Freq: Every day | ORAL | 1 refills | Status: DC

## 2017-10-13 NOTE — Telephone Encounter (Signed)
Patient Instructions by Loa SocksMartin, Ivy M, LPN at 40/98/119112/19/2018 11:20 AM   Author: Loa SocksMartin, Ivy M, LPN Author Type: Licensed Practical Nurse Filed: 06/11/2017 12:07 PM  Note Status: Signed Cosign: Cosign Not Required Encounter Date: 06/11/2017  Editor: Loa SocksMartin, Ivy M, LPN (Licensed Practical Nurse)    Medication Instructions:   INCREASE YOUR LISINOPRIL TO 20 MG ONCE DAILY     Follow-Up:  Your physician wants you to follow-up in: ONE YEAR WITH DR Johnell ComingsNELSON You will receive a reminder letter in the mail two months in advance. If you don't receive a letter, please call our office to schedule the follow-up appointment.

## 2017-11-06 ENCOUNTER — Other Ambulatory Visit: Payer: Self-pay | Admitting: Family Medicine

## 2017-11-06 DIAGNOSIS — Z1231 Encounter for screening mammogram for malignant neoplasm of breast: Secondary | ICD-10-CM

## 2017-12-18 ENCOUNTER — Ambulatory Visit
Admission: RE | Admit: 2017-12-18 | Discharge: 2017-12-18 | Disposition: A | Discharge status: Home / Self Care | Payer: BLUE CROSS/BLUE SHIELD | Source: Ambulatory Visit | Attending: Family Medicine | Admitting: Family Medicine

## 2017-12-18 DIAGNOSIS — Z1231 Encounter for screening mammogram for malignant neoplasm of breast: Secondary | ICD-10-CM

## 2018-01-19 ENCOUNTER — Other Ambulatory Visit: Payer: Self-pay | Admitting: Obstetrics and Gynecology

## 2018-01-19 ENCOUNTER — Other Ambulatory Visit (HOSPITAL_COMMUNITY)
Admission: RE | Admit: 2018-01-19 | Discharge: 2018-01-19 | Disposition: A | Discharge status: Home / Self Care | Payer: BLUE CROSS/BLUE SHIELD | Source: Ambulatory Visit | Attending: Obstetrics and Gynecology | Admitting: Obstetrics and Gynecology

## 2018-01-19 DIAGNOSIS — Z01411 Encounter for gynecological examination (general) (routine) with abnormal findings: Secondary | ICD-10-CM | POA: Insufficient documentation

## 2018-01-20 LAB — CYTOLOGY - PAP
Diagnosis: NEGATIVE
HPV: NOT DETECTED

## 2018-04-05 ENCOUNTER — Other Ambulatory Visit: Payer: Self-pay | Admitting: Cardiology

## 2018-05-12 IMAGING — MG 2D DIGITAL SCREENING BILATERAL MAMMOGRAM WITH CAD AND ADJUNCT TO
8 of 13 series · 8 of 29 positions shown · non-contrast
Comparison: Previous exam(s).

CLINICAL DATA: Screening.

EXAM:
2D DIGITAL SCREENING BILATERAL MAMMOGRAM WITH CAD AND ADJUNCT TOMO

[L MLO (1 of 2)]
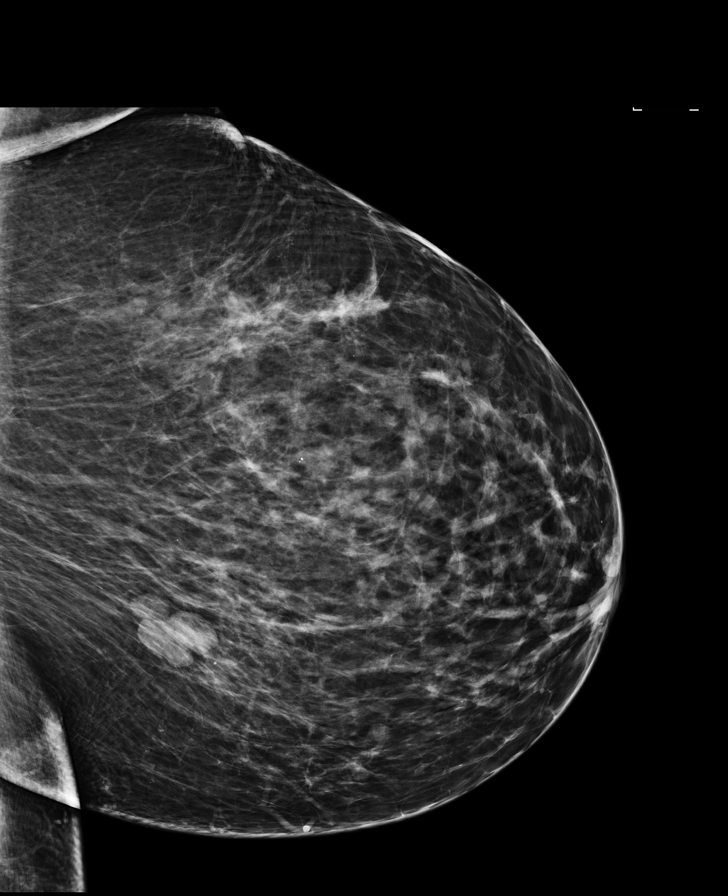

[R CC synth-2D]
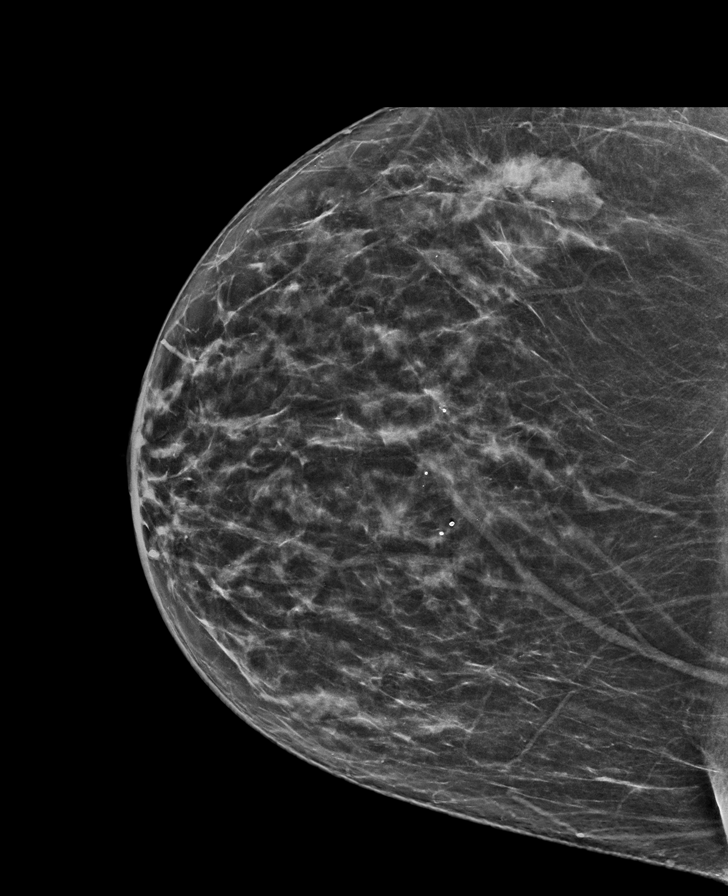

[L MLO synth-2D]
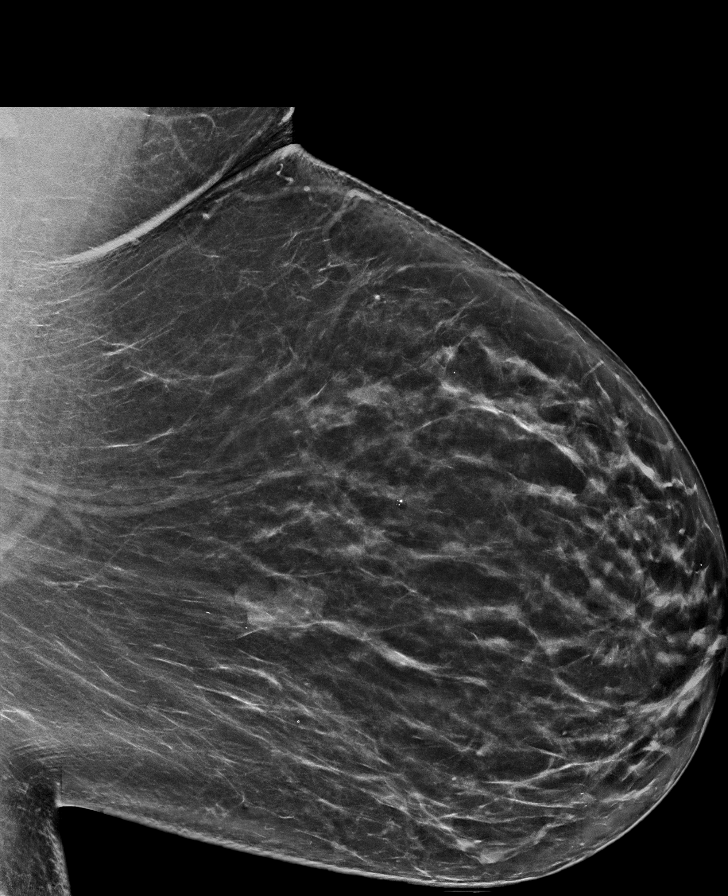

[L CC synth-2D]
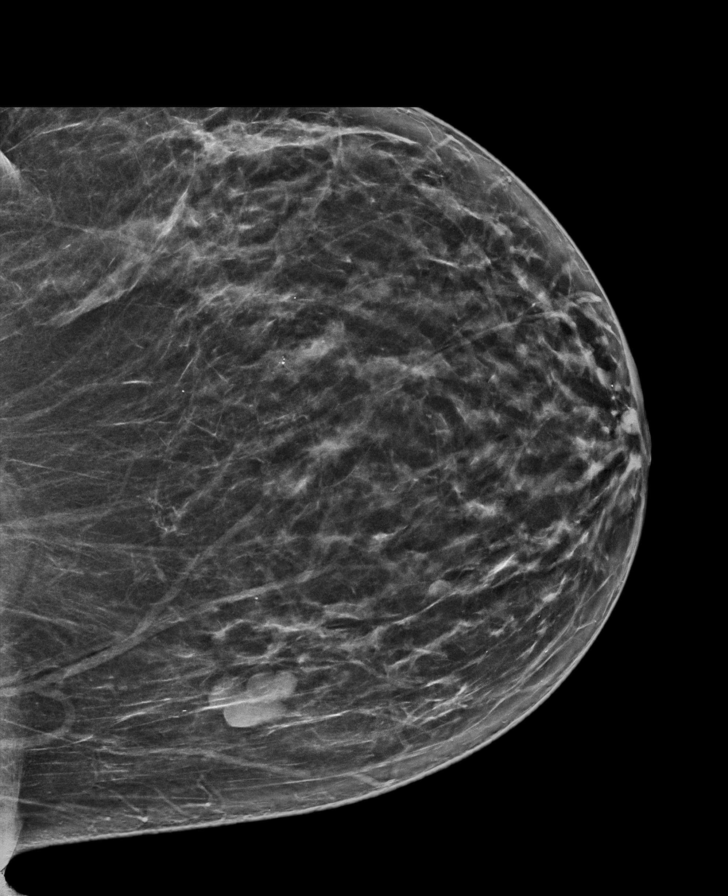

[L MLO (2 of 2)]
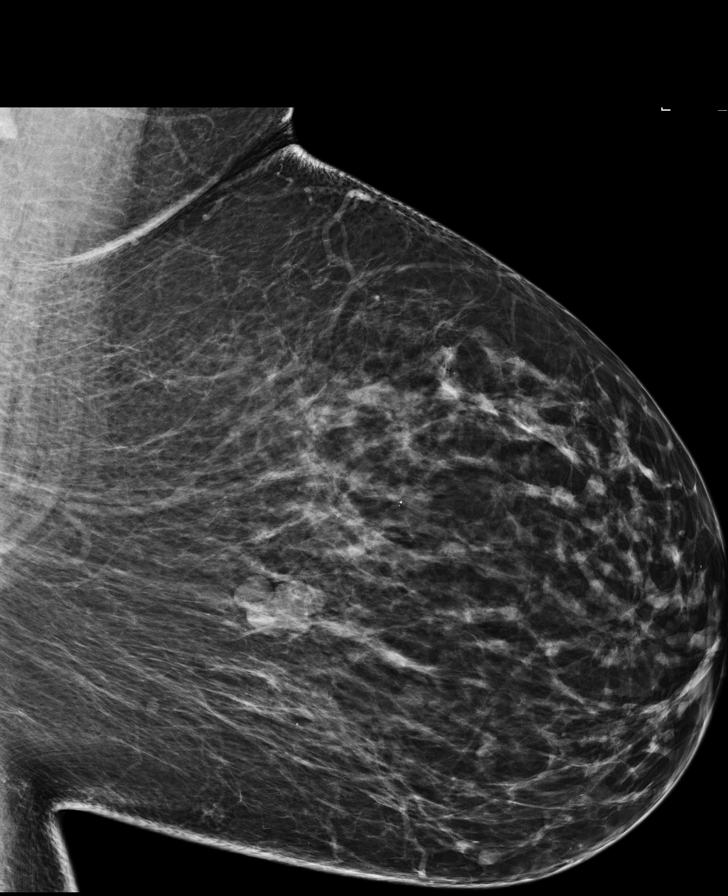

[R CC]
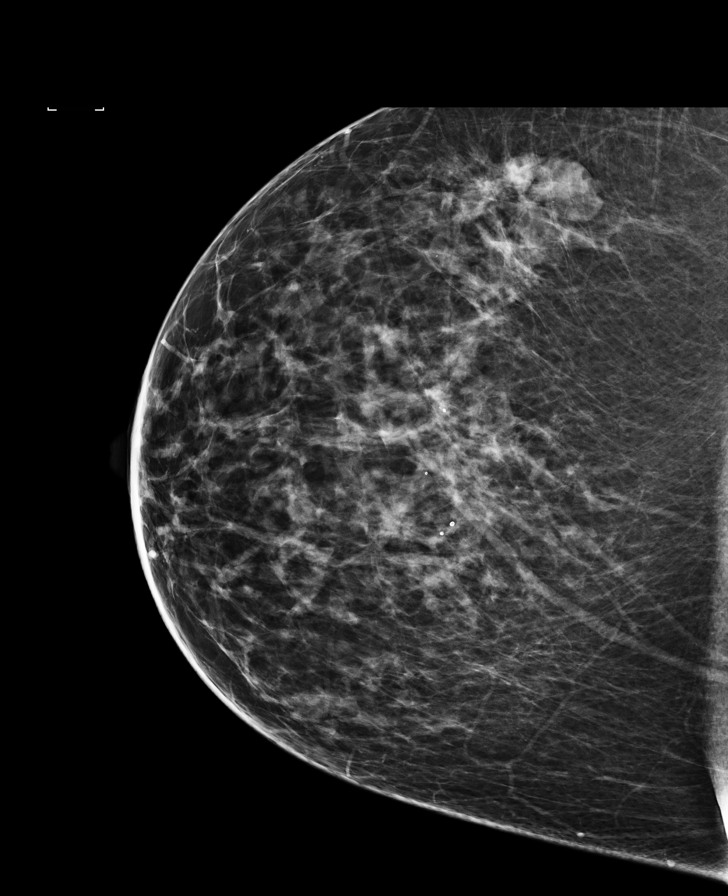

[R MLO]
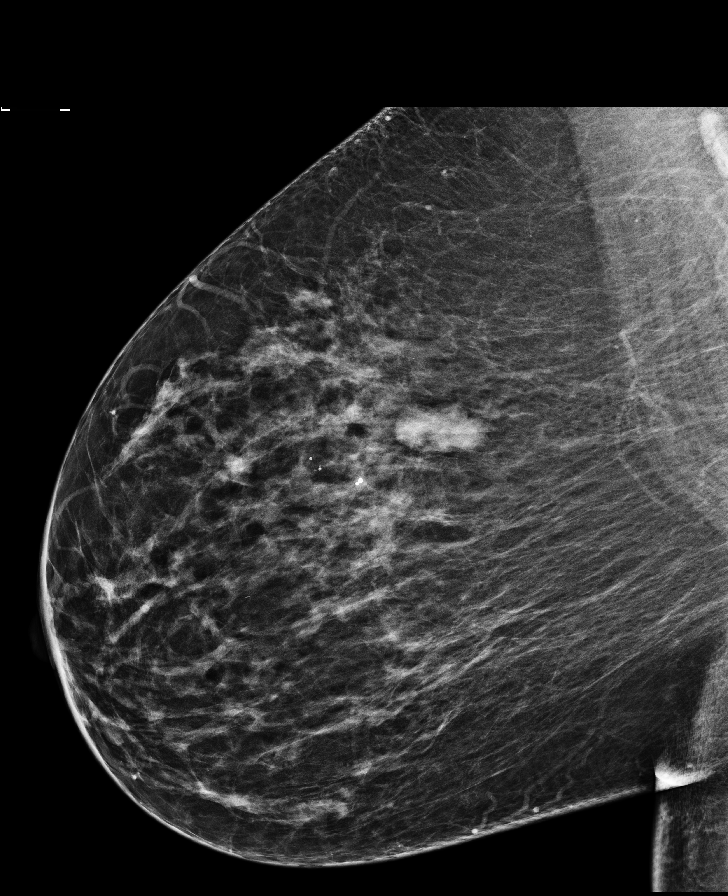

[R MLO synth-2D]
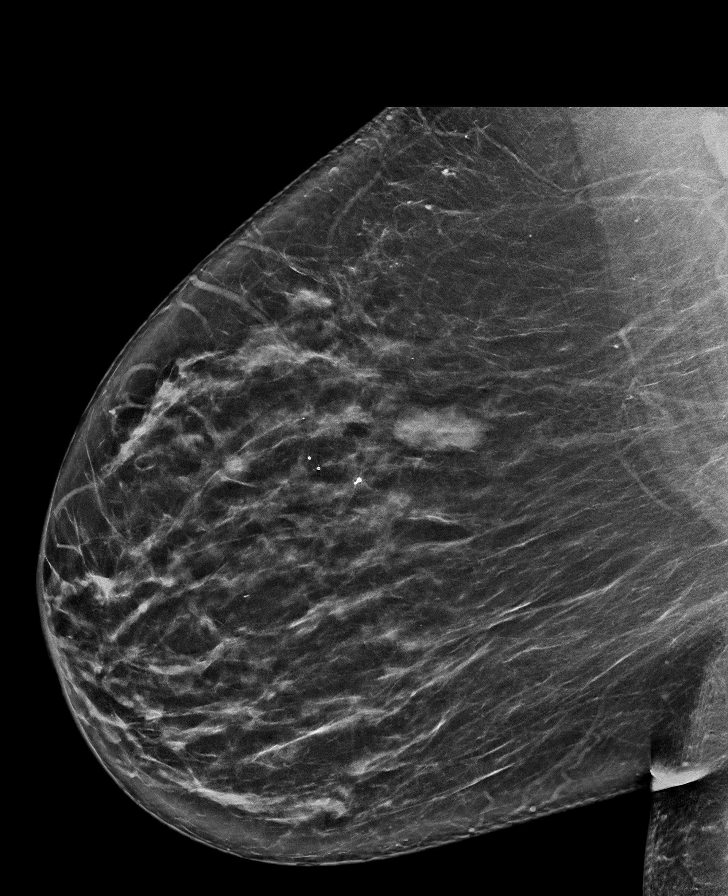

[8 of 29 positions shown; findings below may reference images not displayed]

ACR Breast Density Category b: There are scattered areas of
fibroglandular density.
FINDINGS: There are no findings suspicious for malignancy. Images were
processed with CAD.
IMPRESSION: No mammographic evidence of malignancy. A result letter of this
screening mammogram will be mailed directly to the patient.

RECOMMENDATION:
Screening mammogram in one year. (Code:[id number])

BI-RADS CATEGORY  1: Negative.

## 2018-07-01 ENCOUNTER — Ambulatory Visit (INDEPENDENT_AMBULATORY_CARE_PROVIDER_SITE_OTHER): Payer: 59 | Admitting: Cardiology

## 2018-07-01 ENCOUNTER — Encounter: Payer: Self-pay | Admitting: Cardiology

## 2018-07-01 VITALS — BP 150/102 | HR 60 | Ht 66.5 in | Wt 243.2 lb

## 2018-07-01 DIAGNOSIS — R002 Palpitations: Secondary | ICD-10-CM | POA: Diagnosis not present

## 2018-07-01 DIAGNOSIS — I119 Hypertensive heart disease without heart failure: Secondary | ICD-10-CM | POA: Diagnosis not present

## 2018-07-01 DIAGNOSIS — E782 Mixed hyperlipidemia: Secondary | ICD-10-CM | POA: Diagnosis not present

## 2018-07-01 NOTE — Patient Instructions (Signed)
Medication Instructions:    Your physician recommends that you continue on your current medications as directed. Please refer to the Current Medication list given to you today.  If you need a refill on your cardiac medications before your next appointment, please call your pharmacy.       Follow-Up:  3 MONTHS WITH DR NELSON--OR NEXT AVAILABLE CLOSE TO THAT TIME Jamie Vaughn

## 2018-07-01 NOTE — Progress Notes (Signed)
Cardiology Office Note    Date:  07/01/2018   ID:  Jamie, Vaughn 1955/10/11, MRN [id number]  PCP:  Gweneth Dimitri, MD  Cardiologist:   Tobias Alexander, MD  Referring physician: Gweneth Dimitri, MD  Chief complain: Palpitations  History of Present Illness:  Jamie Vaughn is a 63 y.o. female with prior medical history of obesity, hyperlipidemia, was coming for concern of palpitations. The patient states that she has been through very stressful situation with her father being diagnosed with progressive posttreatment cancer. She has noticed the palpitation first 6 months ago when she visited her father and getting file with her brother. The patient states that these have been every weekend. She wakes up at night with feeling of very strong and very fast heartbeat and a cane last several hours and can be associated with chest tightness but no dizziness, no syncope no shortness of breath. She never experiences her palpitations during the day and very occasionally at home when she is not listing father. She walks 4 times a week for 1 hour and doesn't experience any chest pain or shortness of breath with that. She has experienced chest pain on emotional stress that felt like pressure radiating to her left arm. She is compliant with her Crestor. And has no side effects. There is no family history of premature coronary artery disease, heart failure or sudden cardiac death. She denies any lower extremity edema, orthopnea or proximal nocturnal dyspnea.  Labs are normal creatinine in electrolytes normal LFTs, triglycerides 114, HDL 49, LDL 99 these were collected on 11/15/2015.  07/01/2018 - the patient is coming after 6 months, she has been having palpitations - fast in the mornings, followed by a strong beat that terminates it.  Also has noticed hypertension in the mornings in 150s but normal in the afternoons.  Seen by her primary care physician who recommended sleep apnea evaluation.  The patient has  gained weight, she has sedentary job that is very stressful and has not been able to exercise lately.  Denies any dyspnea or lower extremity edema.  Past Medical History:  Diagnosis Date  . Asymptomatic varicose veins   . Chronic constipation   . GERD (gastroesophageal reflux disease)   . Hyperlipidemia   . Hypertension   . Postmenopausal 08/2010  . Pre-diabetes   . Wears glasses    Past Surgical History:  Procedure Laterality Date  . DILATION AND CURETTAGE OF UTERUS  1983  . DOBUTAMINE STRESS ECHO  12-21-2015 dr Aris Lot Grabiela Wohlford   negative -- no evidence of ischemia  . EUA/  BANDING 3 INTERNAL HEMORRHROIDS  11-23-2004  dr Carolynne Edouard  . HYSTEROSCOPY W/D&C N/A 01/21/2017   Procedure: DILATATION AND CURETTAGE /HYSTEROSCOPY/ myosure / polypectomy;  Surgeon: Geryl Rankins, MD;  Location: [facility] [place];  Service: Gynecology;  Laterality: N/A;  . TRANSTHORACIC ECHOCARDIOGRAM  12/21/2015   mild LVH, ef 60-65%/  trivial MR and TR/  mild LAE   Current Medications: Outpatient Medications Prior to Visit  Medication Sig Dispense Refill  . ALPRAZolam (XANAX) 1 MG tablet Take 1 mg by mouth at bedtime as needed for anxiety.    . Cholecalciferol (VITAMIN D3) 2000 units capsule Take 4,000 Units by mouth daily.     . Chromium Picolinate 800 MCG TABS Take 1 tablet by mouth daily.    . Cobalamine Combinations (B-12) 1000-400 MCG SUBL Place under the tongue.    . Coenzyme Q10 300 MG CAPS Take 2 capsules by mouth daily.    Marland Kitchen  lisinopril (PRINIVIL,ZESTRIL) 20 MG tablet Take 1 tablet (20 mg total) by mouth daily. Please keep upcoming appt with Dr. Delton SeeNelson for future refills. 90 tablet 0  . Multiple Vitamins-Minerals (EYE VITAMINS) CAPS Take 1 capsule by mouth daily.    . naproxen (NAPROSYN) 500 MG tablet Take 500 mg by mouth 2 (two) times daily as needed.     Marland Kitchen. omeprazole (PRILOSEC OTC) 20 MG tablet Take 20 mg by mouth every morning.     . polyethylene glycol (MIRALAX / GLYCOLAX) packet Take 17 g by  mouth every morning.     . rosuvastatin (CRESTOR) 20 MG tablet Take 20 mg by mouth every evening.     . vitamin C (ASCORBIC ACID) 500 MG tablet Take 500 mg by mouth daily.    Marland Kitchen. ibuprofen (ADVIL,MOTRIN) 600 MG tablet Take 1 tablet (600 mg total) by mouth every 6 (six) hours as needed. (Patient not taking: Reported on 07/01/2018) 20 tablet 0  . Multiple Vitamins-Minerals (CENTRUM SILVER ADULT 50+) TABS Take 1 tablet by mouth daily.     No facility-administered medications prior to visit.      Allergies:   Latex   Social History   Socioeconomic History  . Marital status: Single    Spouse name: Not on file  . Number of children: Not on file  . Years of education: Not on file  . Highest education level: Not on file  Occupational History  . Occupation: PROPERTY MANAGER  Social Needs  . Financial resource strain: Not on file  . Food insecurity:    Worry: Not on file    Inability: Not on file  . Transportation needs:    Medical: Not on file    Non-medical: Not on file  Tobacco Use  . Smoking status: Former Smoker    Years: 30.00    Types: Cigarettes    Last attempt to quit: 03/23/2009    Years since quitting: 9.2  . Smokeless tobacco: Never Used  Substance and Sexual Activity  . Alcohol use: Yes    Alcohol/week: 7.0 standard drinks    Types: 7 Glasses of wine per week    Comment: daily wine  . Drug use: No  . Sexual activity: Not on file  Lifestyle  . Physical activity:    Days per week: Not on file    Minutes per session: Not on file  . Stress: Not on file  Relationships  . Social connections:    Talks on phone: Not on file    Gets together: Not on file    Attends religious service: Not on file    Active member of club or organization: Not on file    Attends meetings of clubs or organizations: Not on file    Relationship status: Not on file  Other Topics Concern  . Not on file  Social History Narrative  . Not on file     Family History:  The patient's family  history includes Breast cancer in her mother; Lung cancer in her mother; Melanoma in her father; Prostate cancer in her father; Thyroid cancer in her mother.   ROS:   Please see the history of present illness.    ROS All other systems reviewed and are negative.   PHYSICAL EXAM:   VS:  BP (!) 150/102   Pulse 60   Ht 5' 6.5" (1.689 m)   Wt 243 lb 3.2 oz (110.3 kg)   LMP 12/12/2011   SpO2 94%   BMI 38.67 kg/m  GEN: Well nourished, well developed, in no acute distress  HEENT: normal  Neck: no JVD, carotid bruits, or masses Cardiac: RRR; no murmurs, rubs, or gallops,no edema  Respiratory:  clear to auscultation bilaterally, normal work of breathing GI: soft, nontender, nondistended, + BS MS: no deformity or atrophy  Skin: warm and dry, no rash Neuro:  Alert and Oriented x 3, Strength and sensation are intact Psych: euthymic mood, full affect  Wt Readings from Last 3 Encounters:  07/01/18 243 lb 3.2 oz (110.3 kg)  06/11/17 241 lb 9.6 oz (109.6 kg)  01/21/17 235 lb 8 oz (106.8 kg)    Studies/Labs Reviewed:   EKG:  EKG is ordered today.  The ekg ordered today demonstrates Normal sinus rhythm and normal EKG.ent Labs: No results found for requested labs within last 8760 hours.   Lipid Panel No results found for: CHOL, TRIG, HDL, CHOLHDL, VLDL, LDLCALC, LDLDIRECT  Additional studies/ records that were reviewed today include:  Records reviewed from her PCP as described in history of present illness.    ASSESSMENT:    1. Mixed hyperlipidemia   2. Hypertensive heart disease without heart failure   3. Palpitations     PLAN:  In order of problems listed above:  1. Hypertensive heart disease without heart failure, echocardiogram showed normal LVEF is greater than diastolic dysfunction and mild concentric LVH stress test followed blood pressure elevation on exertion to 230 mmHg. we will increased lisinopril from 10-20 mg daily as she remains hypertensive.  She continues to have  high blood pressure in the mornings, I agree with sleep apnea evaluation.  For now continue the same medications of hydrochlorothiazide 25 the mornings and lisinopril 20 in the evenings.  Her EKG is normal and shows no signs of LVH. 2. Hyperlipidemia currently on Crestor 5 mg daily that she is well tolerated, will obtain labs from her primary care physician.  3. Palpitations -she wants to hold off on arrhythmia evaluation after the sleep apnea evaluation. 4. Obesity -advised to start regular exercise and potential weight loss that might improve other symptoms and help with her hypertension.  Medication Adjustments/Labs and Tests Ordered: Current medicines are reviewed at length with the patient today.  Concerns regarding medicines are outlined above.  Medication changes, Labs and Tests ordered today are listed in the Patient Instructions below. Patient Instructions  Medication Instructions:    Your physician recommends that you continue on your current medications as directed. Please refer to the Current Medication list given to you today.  If you need a refill on your cardiac medications before your next appointment, please call your pharmacy.       Follow-Up:  3 MONTHS WITH DR Tala Eber--OR NEXT AVAILABLE CLOSE TO THAT TIME FRAME      Signed, Tobias AlexanderKatarina Hodges Treiber, MD  07/01/2018 3:59 PM    [facility] [address], [place], [place]  [address] Phone: [phone]; Fax: [phone]) [phone]

## 2018-08-27 IMAGING — US US TRANSVAGINAL NON-OB
1 series · 13 of 25 positions shown · non-contrast
Comparison: None

CLINICAL DATA: Intermittent spotting. Postmenopausal bleeding for 2
months.

EXAM:
TRANSABDOMINAL AND TRANSVAGINAL ULTRASOUND OF PELVIS
TECHNIQUE: Both transabdominal and transvaginal ultrasound examinations of the
pelvis were performed. Transabdominal technique was performed for
global imaging of the pelvis including uterus, ovaries, adnexal
regions, and pelvic cul-de-sac. It was necessary to proceed with
endovaginal exam following the transabdominal exam to visualize the
uterus and ovaries.

[Series 1: us transvaginal non-ob · 0.23mm/px · 13 of 40 slices shown]
[im 1/40]
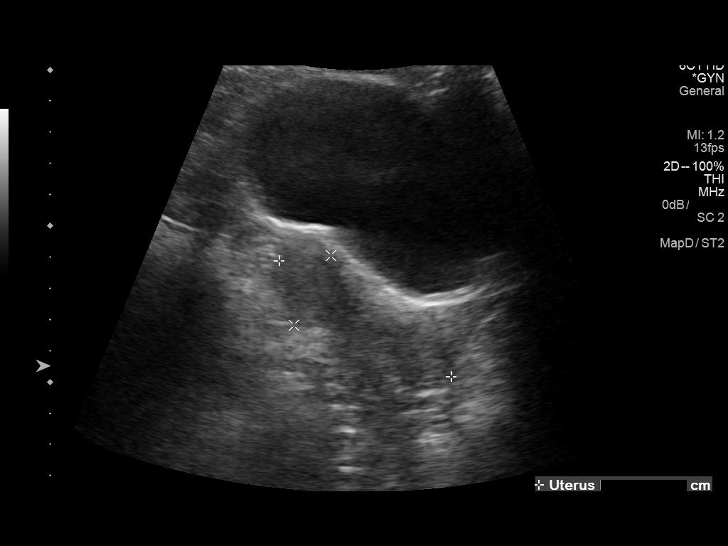
[im 4/40]
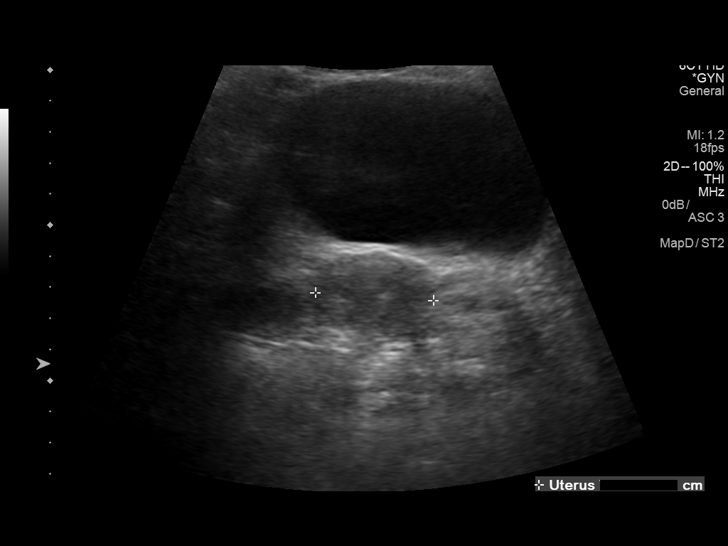
[im 7/40]
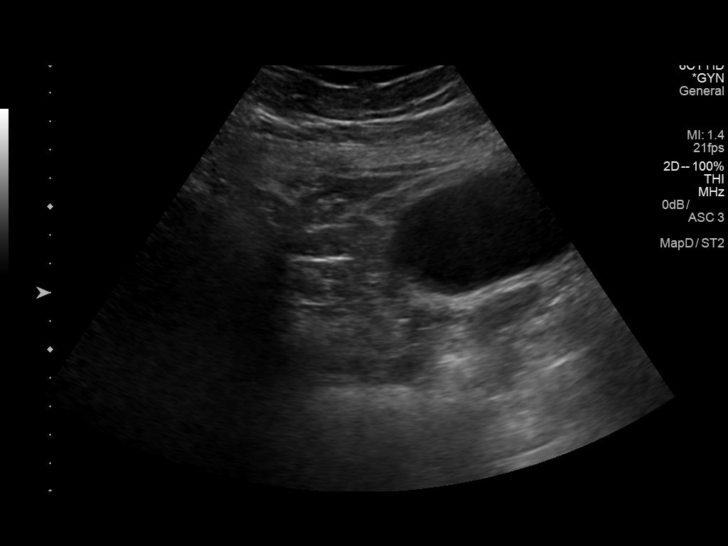
[im 10/40]
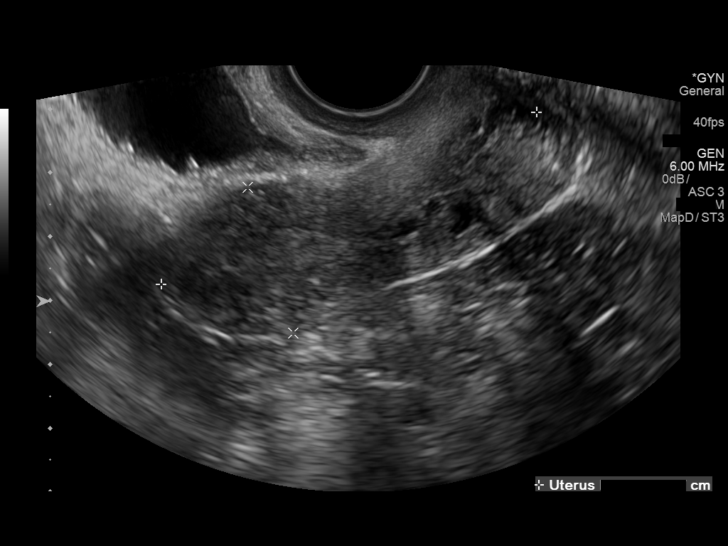
[im 14/40]
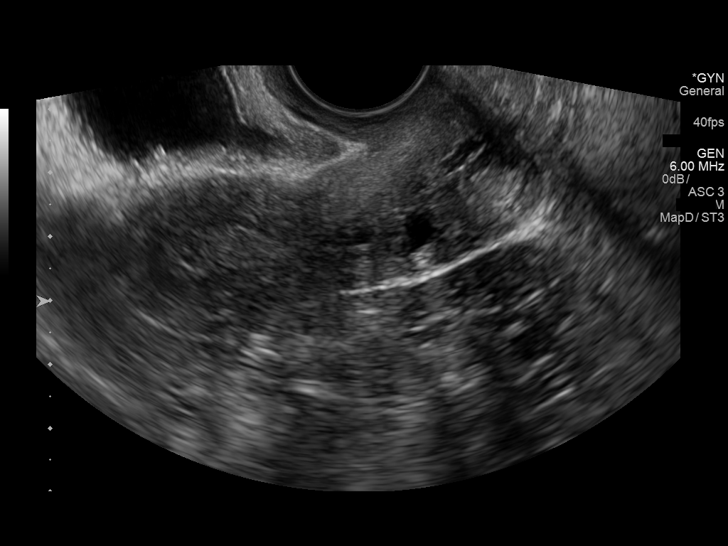
[im 17/40]
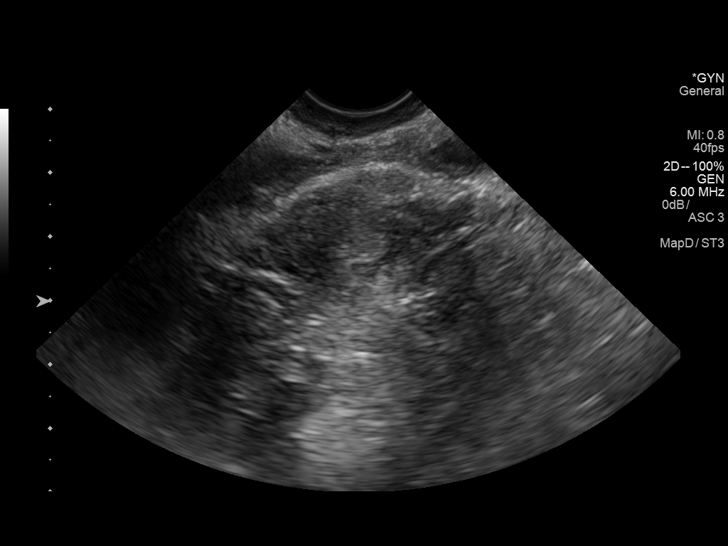
[im 20/40]
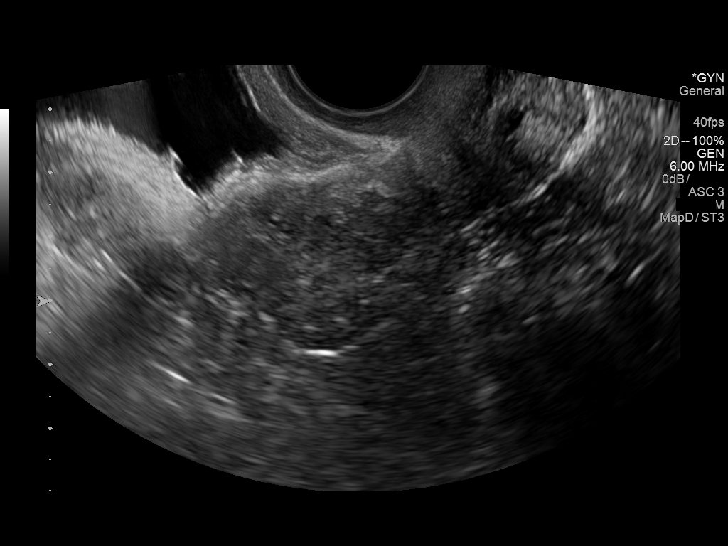
[im 23/40]
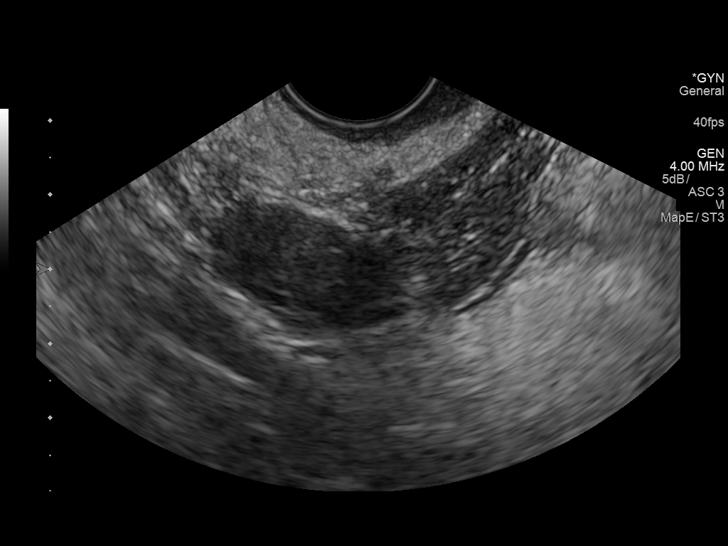
[im 27/40]
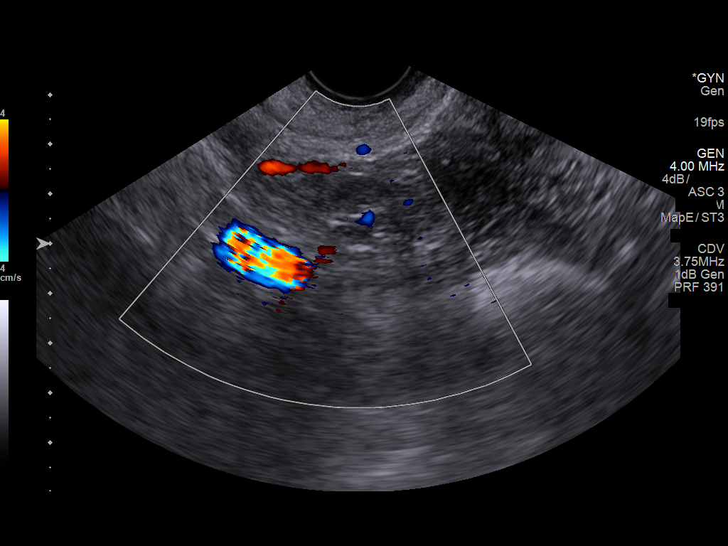
[im 30/40]
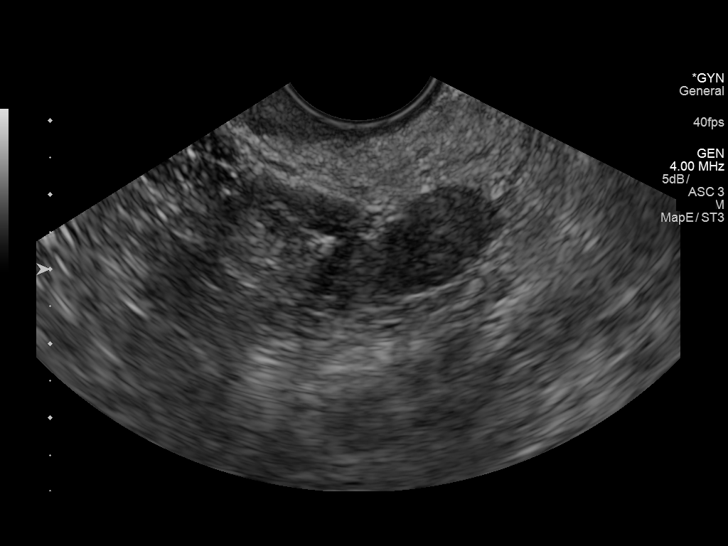
[im 33/40]
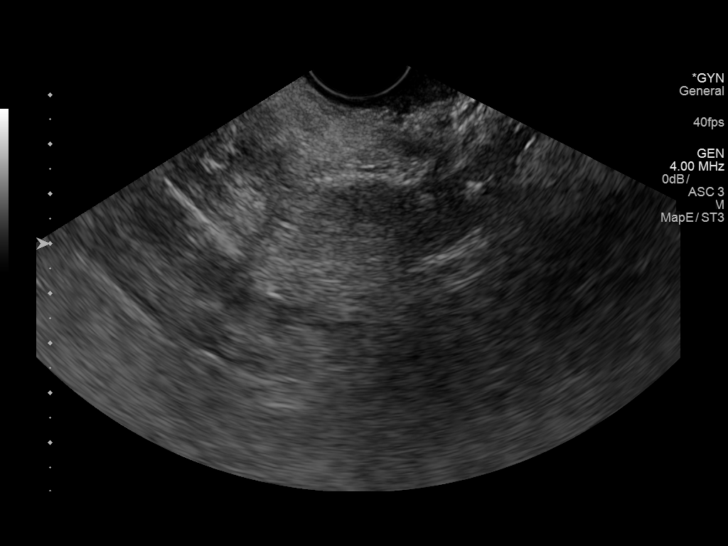
[im 36/40]
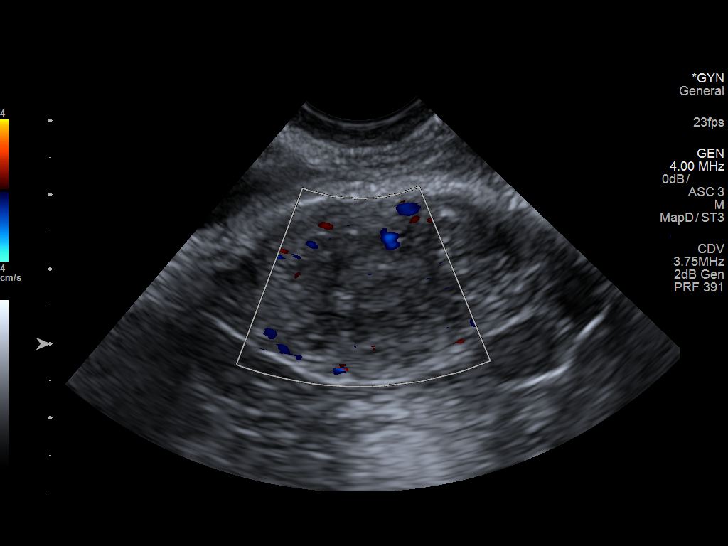
[im 40/40]
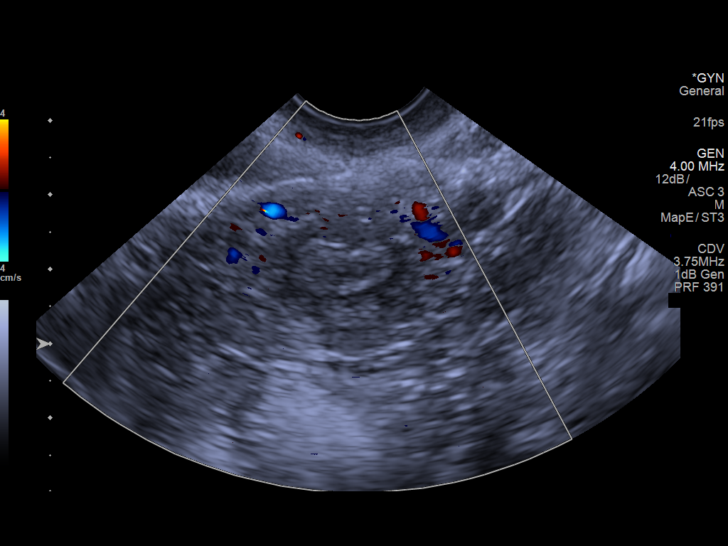

[13 of 25 positions shown; findings below may reference images not displayed]

FINDINGS: Uterus

Measurements: 6.5 x 2.4 x 3.9 cm. No focal abnormality identified.

Endometrium

Thickness: 8.4 mm.  No focal abnormality visualized.

Right ovary

Measurements: 2.2 x 1.3 x 1.7 cm. Normal appearance/no adnexal mass.

Left ovary

Measurements: 1.9 x 1.0 x 2.0 cm. Normal appearance/no adnexal mass.

Other findings

No abnormal free fluid.
IMPRESSION: Endometrial thickening that 8.4 mm. In the setting of
post-menopausal bleeding, endometrial sampling is indicated to
exclude carcinoma. If results are benign, sonohysterogram should be
considered for focal lesion work-up. (Ref: Radiological Reasoning:
Algorithmic Workup of Abnormal Vaginal Bleeding with Endovaginal
Sonography and Sonohysterography. AJR 1229; 191:S68-73)

## 2018-09-15 ENCOUNTER — Encounter: Payer: Self-pay | Admitting: *Deleted

## 2018-09-18 ENCOUNTER — Telehealth: Payer: Self-pay | Admitting: Cardiology

## 2018-09-18 MED ORDER — LISINOPRIL 20 MG PO TABS: 20.0000 mg | ORAL_TABLET | Freq: Every day | ORAL | 2 refills | Status: AC

## 2018-09-18 MED ORDER — ROSUVASTATIN CALCIUM 20 MG PO TABS: 20.0000 mg | ORAL_TABLET | Freq: Every evening | ORAL | 2 refills | Status: DC

## 2018-09-18 NOTE — Telephone Encounter (Signed)
I have reviewed patient's chart and based on her history we will postpone her appointment for over 12 weeks, please check that she has a blood pressure cuff at home, we will review and advise about any needed medication changes.  Please make sure she does not have any new or worsening symptoms.  Also please make sure she has refills.  Tobias Alexander, MD 09/18/2018

## 2018-09-18 NOTE — Telephone Encounter (Signed)
   Primary Cardiologist:  Tobias Alexander, MD   Patient contacted.  History reviewed.  No symptoms to suggest any unstable cardiac conditions.  Based on discussion, with current pandemic situation, we will be postponing this appointment for [facility] with a plan for f/u in 12 wks or greater if feasible/necessary.  If symptoms change, she has been instructed to contact our office.   Routing to C19 CANCEL pool for tracking (P CV DIV CV19 CANCEL - reason for visit "other.") and assigning priority (1 = 4-6 wks, 2 = 6-12 wks, 3 = >12 wks).   Loa Socks, LPN  6/86/1683 72:90 PM    All refills were sent into her confirmed pharmacy of choice.  Pt verbalized understanding and agrees with this plan.    Marland Kitchen

## 2018-09-18 NOTE — Addendum Note (Signed)
Addended by: Loa Socks on: 09/18/2018 12:41 PM   Modules accepted: Orders

## 2018-09-24 ENCOUNTER — Ambulatory Visit: Payer: 59 | Admitting: Cardiology

## 2018-10-14 NOTE — Telephone Encounter (Signed)
Pt was contacted re: rescheduling her appt that was cancelled with Dr. Delton See in March due to covid19. Pt was offered a virtual visit. Pt states that she hasn't been checking her bp and she's not sure if virtual visit was even needed. Pt was advised to monitor her bp for the next 1-2 weeks and to call with those readings and then we will let Dr. Delton See decide if she can pus her appt out for a few months or if she needed a virtual visit. Pt agrees with the plan and thanked me for the call.  Will route to Dr. Delton See and Aggie Hacker, LPN to make them aware of the communication. Will delete out of the covid19 pool since pt will be calling back.

## 2018-11-26 ENCOUNTER — Other Ambulatory Visit: Payer: Self-pay | Admitting: Family Medicine

## 2018-11-26 DIAGNOSIS — Z1231 Encounter for screening mammogram for malignant neoplasm of breast: Secondary | ICD-10-CM

## 2019-01-13 ENCOUNTER — Other Ambulatory Visit: Payer: Self-pay

## 2019-01-13 ENCOUNTER — Ambulatory Visit
Admission: RE | Admit: 2019-01-13 | Discharge: 2019-01-13 | Disposition: A | Discharge status: Home / Self Care | Payer: 59 | Source: Ambulatory Visit | Attending: Family Medicine | Admitting: Family Medicine

## 2019-01-13 DIAGNOSIS — Z1231 Encounter for screening mammogram for malignant neoplasm of breast: Secondary | ICD-10-CM

## 2019-05-13 IMAGING — MG DIGITAL SCREENING BILATERAL MAMMOGRAM WITH TOMO AND CAD
8 series · 8 of 24 positions shown · non-contrast
Comparison: Previous exam(s).

CLINICAL DATA: Screening.

EXAM:
DIGITAL SCREENING BILATERAL MAMMOGRAM WITH TOMO AND CAD

[L MLO synth-2D]
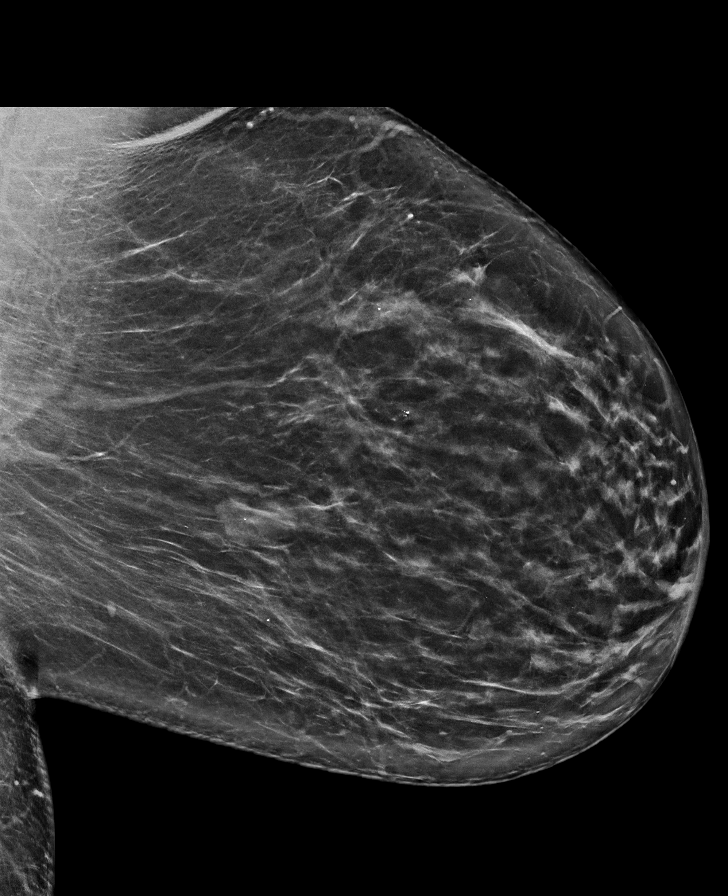

[R MLO synth-2D]
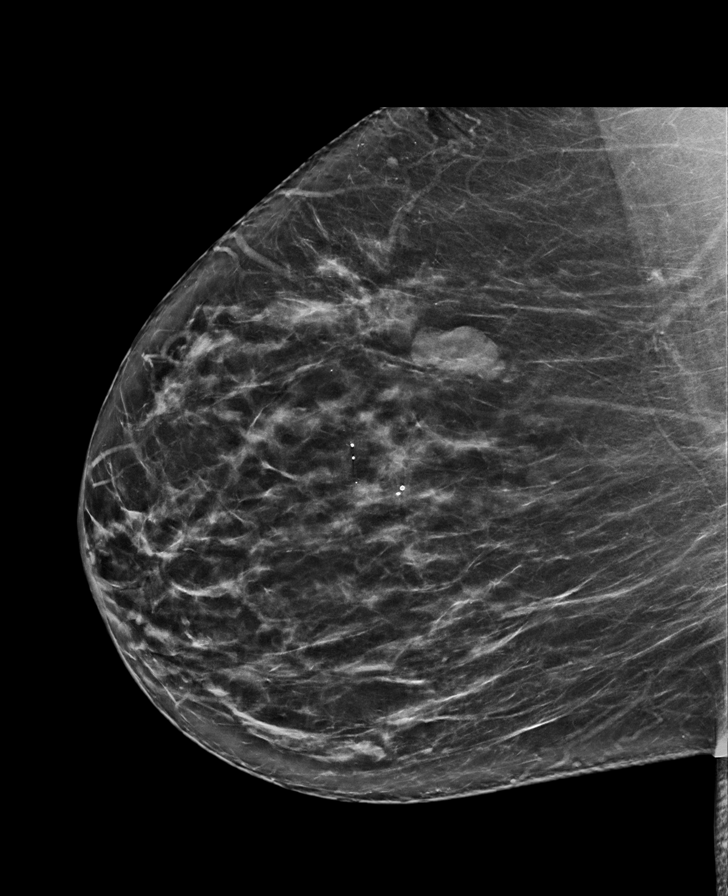

[R CC synth-2D]
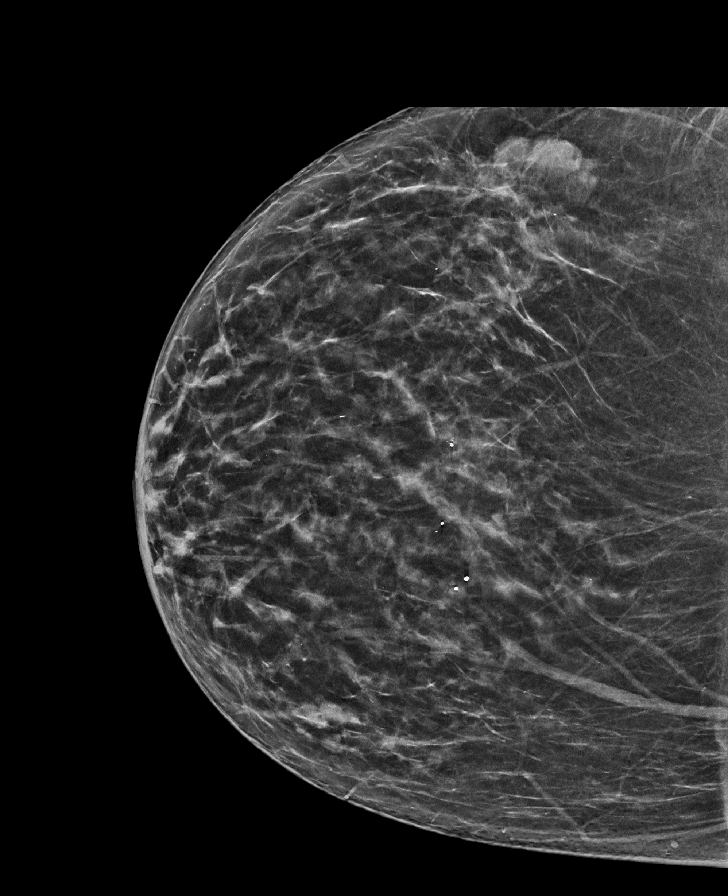

[L CC synth-2D]
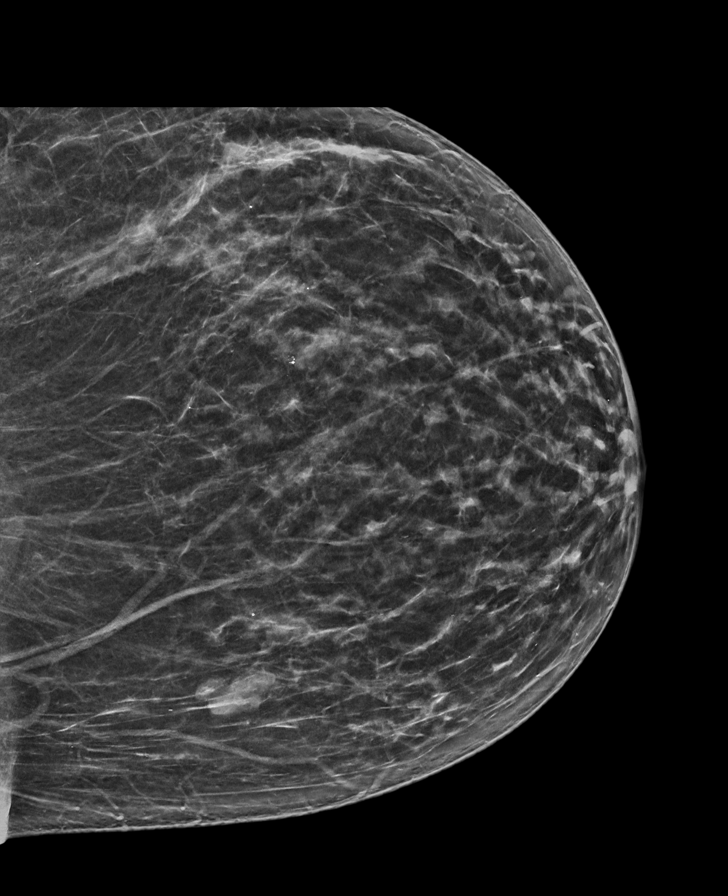

[R CC tomo · tomo slice 38/75.0]
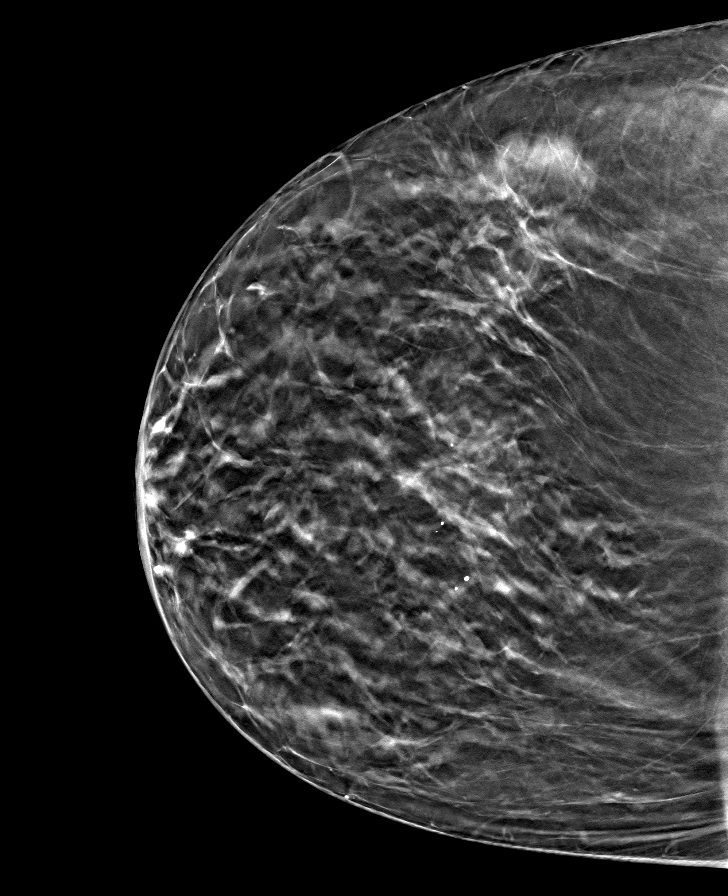

[R MLO tomo · tomo slice 44/87.0]
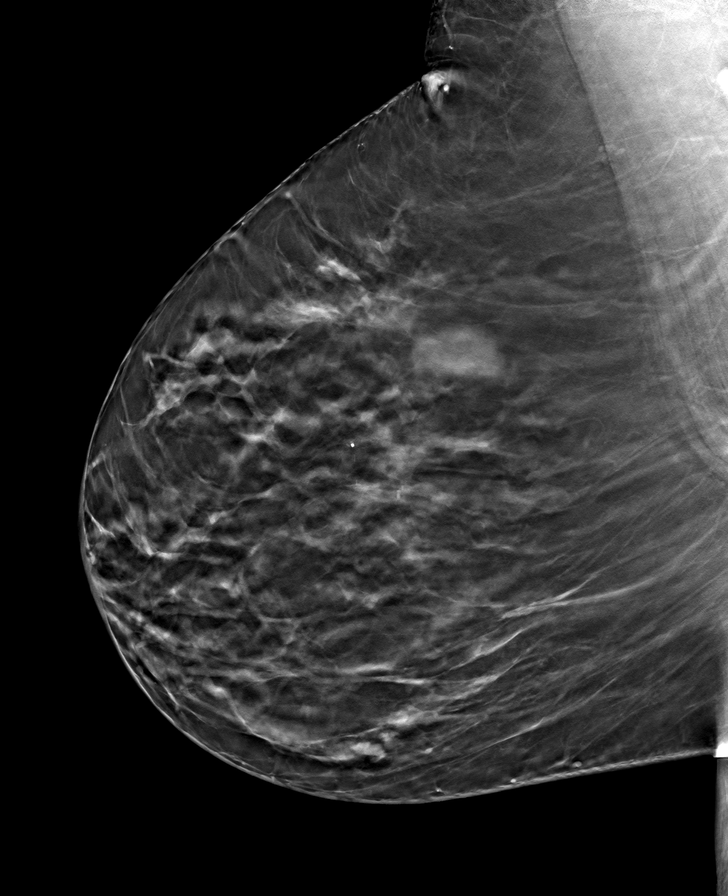

[L MLO tomo · tomo slice 46/91.0]
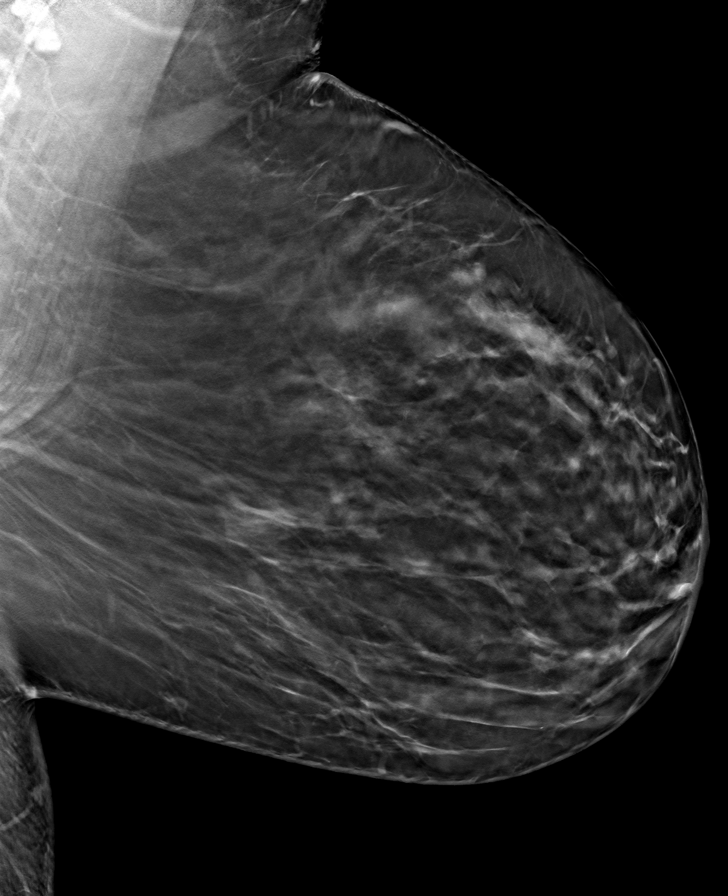

[L CC tomo · tomo slice 37/73.0]
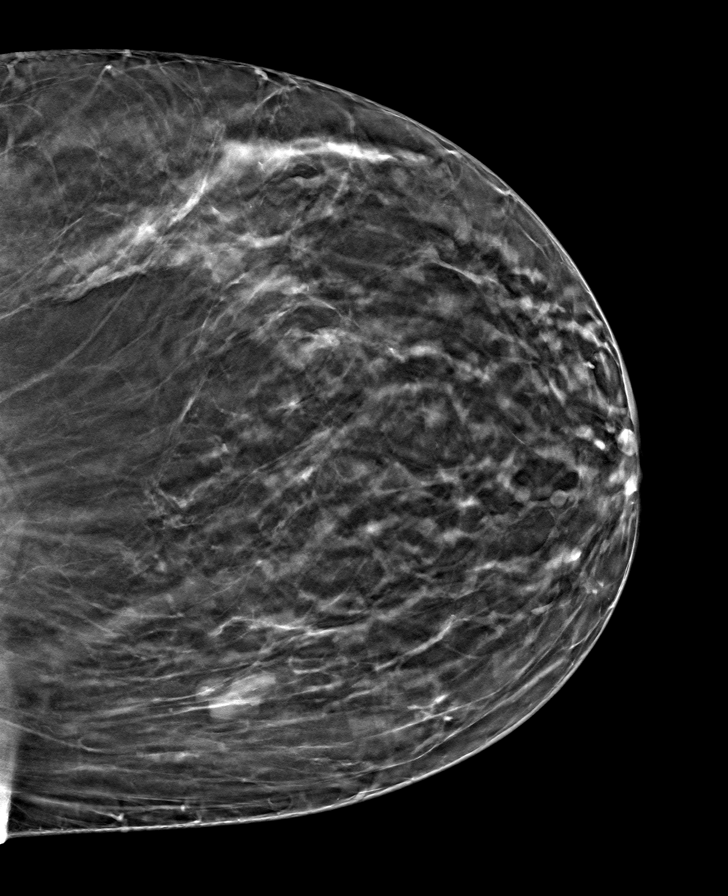

[8 of 24 positions shown; findings below may reference images not displayed]

ACR Breast Density Category b: There are scattered areas of
fibroglandular density.
FINDINGS: There are no findings suspicious for malignancy. Images were
processed with CAD.
IMPRESSION: No mammographic evidence of malignancy. A result letter of this
screening mammogram will be mailed directly to the patient.

RECOMMENDATION:
Screening mammogram in one year. (Code:[id number])

BI-RADS CATEGORY  1: Negative.

## 2019-05-26 ENCOUNTER — Telehealth: Payer: Self-pay | Admitting: Radiology

## 2019-05-26 ENCOUNTER — Other Ambulatory Visit: Payer: Self-pay

## 2019-05-26 ENCOUNTER — Ambulatory Visit (INDEPENDENT_AMBULATORY_CARE_PROVIDER_SITE_OTHER): Payer: PRIVATE HEALTH INSURANCE | Admitting: Cardiology

## 2019-05-26 ENCOUNTER — Encounter: Payer: Self-pay | Admitting: Cardiology

## 2019-05-26 VITALS — BP 138/84 | HR 64 | Ht 65.5 in | Wt 235.8 lb

## 2019-05-26 DIAGNOSIS — R002 Palpitations: Secondary | ICD-10-CM

## 2019-05-26 NOTE — Patient Instructions (Signed)
Medication Instructions:   Your physician recommends that you continue on your current medications as directed. Please refer to the Current Medication list given to you today.   *If you need a refill on your cardiac medications before your next appointment, please call your pharmacy*   Testing/Procedures:  ZIO XT- Long Term Monitor Instructions   Your physician has requested you wear your ZIO patch monitor__7__days.   This is a single patch monitor.  [employer] supplies one patch monitor per enrollment.  Additional stickers are not available.   Please do not apply patch if you will be having a Nuclear Stress Test, Echocardiogram, Cardiac CT, MRI, or Chest Xray during the time frame you would be wearing the monitor. The patch cannot be worn during these tests.  You cannot remove and re-apply the ZIO XT patch monitor.   Your ZIO patch monitor will be sent USPS Priority mail from [facility] directly to your home address. The monitor may also be mailed to a PO BOX if home delivery is not available.   It may take 3-5 days to receive your monitor after you have been enrolled.   Once you have received you monitor, please review enclosed instructions.  Your monitor has already been registered assigning a specific monitor serial # to you.   Applying the monitor   Shave hair from upper left chest.   Hold abrader disc by orange tab.  Rub abrader in 40 strokes over left upper chest as indicated in your monitor instructions.   Clean area with 4 enclosed alcohol pads .  Use all pads to assure are is cleaned thoroughly.  Let dry.   Apply patch as indicated in monitor instructions.  Patch will be place under collarbone on left side of chest with arrow pointing upward.   Rub patch adhesive wings for 2 minutes.Remove white label marked "1".  Remove white label marked "2".  Rub patch adhesive wings for 2 additional minutes.   While looking in a mirror, press and release button in center of  patch.  A small green light will flash 3-4 times .  This will be your only indicator the monitor has been turned on.     Do not shower for the first 24 hours.  You may shower after the first 24 hours.   Press button if you feel a symptom. You will hear a small click.  Record Date, Time and Symptom in the Patient Log Book.   When you are ready to remove patch, follow instructions on last 2 pages of Patient Log Book.  Stick patch monitor onto last page of Patient Log Book.   Place Patient Log Book in [place] box.  Use locking tab on box and tape box closed securely.  The [employer] and [employer] has [employer] on it.  Please place in mailbox as soon as possible.  Your physician should have your test results approximately 7 days after the monitor has been mailed back to [facility].   Call [facility] Customer Care at [phone] if you have questions regarding your ZIO XT patch monitor.  Call them immediately if you see an orange light blinking on your monitor.   If your monitor falls off in less than 4 days contact our Monitor department at [phone].  If your monitor becomes loose or falls off after 4 days call [employer] at [phone] for suggestions on securing your monitor.    Follow-Up: At [facility], you and your health needs are our priority.  As part of our continuing mission to provide you with exceptional heart care, we have created designated Provider Care Teams.  These Care Teams include your primary Cardiologist (physician) and Advanced Practice Providers (APPs -  Physician Assistants and Nurse Practitioners) who all work together to provide you with the care you need, when you need it.  Your next appointment:   6 month(s)  The format for your next appointment:   Either In Person or Virtual  Provider:   Ena Dawley, MD

## 2019-05-26 NOTE — Telephone Encounter (Signed)
Enrolled patient for a Zio monitor to be mailed to patients house.

## 2019-05-26 NOTE — Progress Notes (Signed)
Cardiology Office Note    Date:  05/26/2019   ID:  Jamie, Vaughn [birth year], MRN [id number]  PCP:  Gweneth Dimitri, MD  Cardiologist:   Tobias Alexander, MD  Referring physician: Gweneth Dimitri, MD  Chief complain: Palpitations  History of Present Illness:  Jamie Vaughn is a 63 y.o. female with prior medical history of obesity, hyperlipidemia, was coming for concern of palpitations. The patient states that she has been through very stressful situation with her father being diagnosed with progressive posttreatment cancer. She has noticed the palpitation first 6 months ago when she visited her father and getting file with her brother. The patient states that these have been every weekend. She wakes up at night with feeling of very strong and very fast heartbeat and a cane last several hours and can be associated with chest tightness but no dizziness, no syncope no shortness of breath. She never experiences her palpitations during the day and very occasionally at home when she is not listing father. She walks 4 times a week for 1 hour and doesn't experience any chest pain or shortness of breath with that. She has experienced chest pain on emotional stress that felt like pressure radiating to her left arm. She is compliant with her Crestor. And has no side effects. There is no family history of premature coronary artery disease, heart failure or sudden cardiac death. She denies any lower extremity edema, orthopnea or proximal nocturnal dyspnea.  Labs are normal creatinine in electrolytes normal LFTs, triglycerides 114, HDL 49, LDL 99 these were collected on 11/15/2015.  07/01/2018 - the patient is coming after 6 months, she has been having palpitations - fast in the mornings, followed by a strong beat that terminates it.  Also has noticed hypertension in the mornings in 150s but normal in the afternoons.  Seen by her primary care physician who recommended sleep apnea evaluation.  The patient has  gained weight, she has sedentary job that is very stressful and has not been able to exercise lately.  Denies any dyspnea or lower extremity edema.  05/26/2019 - 1 year follow up, the patient is doing well, she has been having stress at work and has been experiencing palpitations that wake her up at nigh, every night. NO associated chest pain or SOB. No LE edema. Her BP has been elevated in the mornings, sometimes up to 180 mmHg.  Past Medical History:  Diagnosis Date  . Asymptomatic varicose veins   . Chronic constipation   . GERD (gastroesophageal reflux disease)   . Hyperlipidemia   . Hypertension   . Postmenopausal 08/2010  . Pre-diabetes   . Wears glasses    Past Surgical History:  Procedure Laterality Date  . DILATION AND CURETTAGE OF UTERUS  1983  . DOBUTAMINE STRESS ECHO  12-21-2015 dr Aris Lot Favor Kreh   negative -- no evidence of ischemia  . EUA/  BANDING 3 INTERNAL HEMORRHROIDS  11-23-2004  dr Carolynne Edouard  . HYSTEROSCOPY W/D&C N/A 01/21/2017   Procedure: DILATATION AND CURETTAGE /HYSTEROSCOPY/ myosure / polypectomy;  Surgeon: Geryl Rankins, MD;  Location: [facility];  Service: Gynecology;  Laterality: N/A;  . TRANSTHORACIC ECHOCARDIOGRAM  12/21/2015   mild LVH, ef 60-65%/  trivial MR and TR/  mild LAE   Current Medications: Outpatient Medications Prior to Visit  Medication Sig Dispense Refill  . ALPRAZolam (XANAX) 1 MG tablet Take 1 mg by mouth at bedtime as needed for anxiety.    Marland Kitchen BLACK ELDERBERRY PO Take 1,150 capsules  by mouth daily.    . Cholecalciferol (VITAMIN D3) 2000 units capsule Take 4,000 Units by mouth daily.     . Chromium Picolinate 800 MCG TABS Take 1 tablet by mouth daily.    . Cobalamine Combinations (B-12) 1000-400 MCG SUBL Place under the tongue.    . Coenzyme Q10 300 MG CAPS Take 2 capsules by mouth daily.    . hydrochlorothiazide (HYDRODIURIL) 25 MG tablet Take 25 mg by mouth every morning.    Marland Kitchen lisinopril (PRINIVIL,ZESTRIL) 20 MG tablet Take  1 tablet (20 mg total) by mouth daily. 90 tablet 2  . Multiple Vitamins-Minerals (EYE VITAMINS) CAPS Take 1 capsule by mouth daily.    . naproxen (NAPROSYN) 500 MG tablet Take 500 mg by mouth 2 (two) times daily as needed.     Marland Kitchen omeprazole (PRILOSEC OTC) 20 MG tablet Take 20 mg by mouth every morning.     . polyethylene glycol (MIRALAX / GLYCOLAX) packet Take 17 g by mouth every morning.     . rosuvastatin (CRESTOR) 20 MG tablet Take 5 mg by mouth daily.    Marland Kitchen UNABLE TO FIND Take 1 capsule by mouth daily. quercet-bromelain    . vitamin C (ASCORBIC ACID) 500 MG tablet Take 500 mg by mouth daily.    . rosuvastatin (CRESTOR) 20 MG tablet Take 1 tablet (20 mg total) by mouth every evening. (Patient taking differently: Take 5 mg by mouth every evening. ) 90 tablet 2   No facility-administered medications prior to visit.      Allergies:   Latex   Social History   Socioeconomic History  . Marital status: Single    Spouse name: Not on file  . Number of children: Not on file  . Years of education: Not on file  . Highest education level: Not on file  Occupational History  . Occupation: PROPERTY MANAGER  Social Needs  . Financial resource strain: Not on file  . Food insecurity    Worry: Not on file    Inability: Not on file  . Transportation needs    Medical: Not on file    Non-medical: Not on file  Tobacco Use  . Smoking status: Former Smoker    Years: 30.00    Types: Cigarettes    Quit date: 03/23/2009    Years since quitting: 10.1  . Smokeless tobacco: Never Used  Substance and Sexual Activity  . Alcohol use: Yes    Alcohol/week: 7.0 standard drinks    Types: 7 Glasses of wine per week    Comment: daily wine  . Drug use: No  . Sexual activity: Not on file  Lifestyle  . Physical activity    Days per week: Not on file    Minutes per session: Not on file  . Stress: Not on file  Relationships  . Social Musician on phone: Not on file    Gets together: Not on file     Attends religious service: Not on file    Active member of club or organization: Not on file    Attends meetings of clubs or organizations: Not on file    Relationship status: Not on file  Other Topics Concern  . Not on file  Social History Narrative  . Not on file     Family History:  The patient's family history includes Breast cancer in her mother; Lung cancer in her mother; Melanoma in her father; Prostate cancer in her father; Thyroid cancer in her mother.  ROS:   Please see the history of present illness.    ROS All other systems reviewed and are negative.  PHYSICAL EXAM:   VS:  BP 138/84   Pulse 64   Ht 5' 5.5" (1.664 m)   Wt 235 lb 12.8 oz (107 kg)   LMP 12/12/2011   SpO2 95%   BMI 38.64 kg/m    GEN: Well nourished, well developed, in no acute distress  HEENT: normal  Neck: no JVD, carotid bruits, or masses Cardiac: RRR; no murmurs, rubs, or gallops,no edema  Respiratory:  clear to auscultation bilaterally, normal work of breathing GI: soft, nontender, nondistended, + BS MS: no deformity or atrophy  Skin: warm and dry, no rash Neuro:  Alert and Oriented x 3, Strength and sensation are intact Psych: euthymic mood, full affect  Wt Readings from Last 3 Encounters:  05/26/19 235 lb 12.8 oz (107 kg)  07/01/18 243 lb 3.2 oz (110.3 kg)  06/11/17 241 lb 9.6 oz (109.6 kg)    Studies/Labs Reviewed:   EKG:  EKG is ordered today.  The ekg ordered today demonstrates Normal sinus rhythm and normal EKG.ent Labs: No results found for requested labs within last 8760 hours.   Lipid Panel No results found for: CHOL, TRIG, HDL, CHOLHDL, VLDL, LDLCALC, LDLDIRECT  Additional studies/ records that were reviewed today include:  Records reviewed from her PCP as described in history of present illness.   ASSESSMENT:    1. Palpitations     PLAN:  In order of problems listed above:  1. Hypertensive heart disease without heart failure, echocardiogram showed normal LVEF is  greater than diastolic dysfunction and mild concentric LVH stress test followed blood pressure elevation on exertion to 230 mmHg. we increased lisinopril from 10->20 mg daily. Her EKG is normal and shows no signs of LVH. She is advised about dietary changes and daily morning walking. She will send [place] BP diary, if BP still elevated, we will increase lisinopril to 40 mg PO daily. 2. Hyperlipidemia currently on Crestor 5 mg daily that she is well tolerated, will obtain labs from her primary care physician.  3. Palpitations -we will obtain 7 day Zio patch monitor. She is not aware of having sleep apnea. Baseline ECG is normal. 4. Obesity -advised to start regular exercise and potential weight loss that might improve other symptoms and help with her hypertension.  Medication Adjustments/Labs and Tests Ordered: Current medicines are reviewed at length with the patient today.  Concerns regarding medicines are outlined above.  Medication changes, Labs and Tests ordered today are listed in the Patient Instructions below. Patient Instructions  Medication Instructions:   Your physician recommends that you continue on your current medications as directed. Please refer to the Current Medication list given to you today.   *If you need a refill on your cardiac medications before your next appointment, please call your pharmacy*   Testing/Procedures:  ZIO XT- Long Term Monitor Instructions   Your physician has requested you wear your ZIO patch monitor__7__days.   This is a single patch monitor.  [employer] supplies one patch monitor per enrollment.  Additional stickers are not available.   Please do not apply patch if you will be having a Nuclear Stress Test, Echocardiogram, Cardiac CT, MRI, or Chest Xray during the time frame you would be wearing the monitor. The patch cannot be worn during these tests.  You cannot remove and re-apply the ZIO XT patch monitor.   Your ZIO patch monitor will be sent  USPS  Priority mail from [employer] directly to your home address. The monitor may also be mailed to a PO BOX if home delivery is not available.   It may take 3-5 days to receive your monitor after you have been enrolled.   Once you have received you monitor, please review enclosed instructions.  Your monitor has already been registered assigning a specific monitor serial # to you.   Applying the monitor   Shave hair from upper left chest.   Hold abrader disc by orange tab.  Rub abrader in 40 strokes over left upper chest as indicated in your monitor instructions.   Clean area with 4 enclosed alcohol pads .  Use all pads to assure are is cleaned thoroughly.  Let dry.   Apply patch as indicated in monitor instructions.  Patch will be place under collarbone on left side of chest with arrow pointing upward.   Rub patch adhesive wings for 2 minutes.Remove white label marked "1".  Remove white label marked "2".  Rub patch adhesive wings for 2 additional minutes.   While looking in a mirror, press and release button in center of patch.  A small green light will flash 3-4 times .  This will be your only indicator the monitor has been turned on.     Do not shower for the first 24 hours.  You may shower after the first 24 hours.   Press button if you feel a symptom. You will hear a small click.  Record Date, Time and Symptom in the Patient Log Book.   When you are ready to remove patch, follow instructions on last 2 pages of Patient Log Book.  Stick patch monitor onto last page of Patient Log Book.   Place Patient Log Book in [place] box.  Use locking tab on box and tape box closed securely.  The [place] and [employer] box has [employer] postage on it.  Please place in mailbox as soon as possible.  Your physician should have your test results approximately 7 days after the monitor has been mailed back to [facility].   Call [facility] Customer Care at [phone] if you have questions  regarding your ZIO XT patch monitor.  Call them immediately if you see an orange light blinking on your monitor.   If your monitor falls off in less than 4 days contact our Monitor department at [phone].  If your monitor becomes loose or falls off after 4 days call [employer] at [phone] for suggestions on securing your monitor.    Follow-Up: At [facility], you and your health needs are our priority.  As part of our continuing mission to provide you with exceptional heart care, we have created designated Provider Care Teams.  These Care Teams include your primary Cardiologist (physician) and Advanced Practice Providers (APPs -  Physician Assistants and Nurse Practitioners) who all work together to provide you with the care you need, when you need it.  Your next appointment:   6 month(s)  The format for your next appointment:   Either In Person or Virtual  Provider:   Tobias AlexanderKatarina Liliah Dorian, MD       Signed, Tobias AlexanderKatarina Bradlee Bridgers, MD  05/26/2019 12:49 PM    [facility] [address], [place], [place]  [address] Phone: [phone]; Fax: [phone]

## 2019-06-09 ENCOUNTER — Other Ambulatory Visit (INDEPENDENT_AMBULATORY_CARE_PROVIDER_SITE_OTHER): Payer: PRIVATE HEALTH INSURANCE

## 2019-06-09 DIAGNOSIS — R002 Palpitations: Secondary | ICD-10-CM | POA: Diagnosis not present

## 2019-06-10 ENCOUNTER — Other Ambulatory Visit: Payer: Self-pay | Admitting: Cardiology

## 2019-07-02 ENCOUNTER — Telehealth: Payer: Self-pay | Admitting: Cardiology

## 2019-07-02 NOTE — Telephone Encounter (Signed)
Lars Masson, MD  06/29/2019 9:39 PM EST     Sinus bradycardia to sinus tachycardia.  3 very short episodes of SVT the longest lasting 6 beats, ocassional PACs and PVCs.  No significant arrhythmias. No therapy is needed    The patient has been notified of the result and verbalized understanding.  All questions (if any) were answered. Loa Socks, LPN 01/30/8915 9:45 PM

## 2019-07-02 NOTE — Telephone Encounter (Signed)
Patient returned call for her monitor results.  

## 2019-07-02 NOTE — Telephone Encounter (Signed)
-----   Message from Loa Socks, LPN sent at 08/28/675  7:28 AM EST -----  ----- Message ----- From: Lars Masson, MD Sent: 06/29/2019   9:39 PM EST To: Loa Socks, LPN   Sinus bradycardia to sinus tachycardia.  3 very short episodes of SVT the longest lasting 6 beats, ocassional PACs and PVCs.   No significant arrhythmias. No therapy is needed

## 2020-06-07 IMAGING — MG DIGITAL SCREENING BILATERAL MAMMOGRAM WITH TOMO AND CAD
6 of 12 series · 6 of 36 positions shown · non-contrast
Comparison: Previous exam(s).

CLINICAL DATA: Screening.

EXAM:
DIGITAL SCREENING BILATERAL MAMMOGRAM WITH TOMO AND CAD

[R MLO synth-2D]
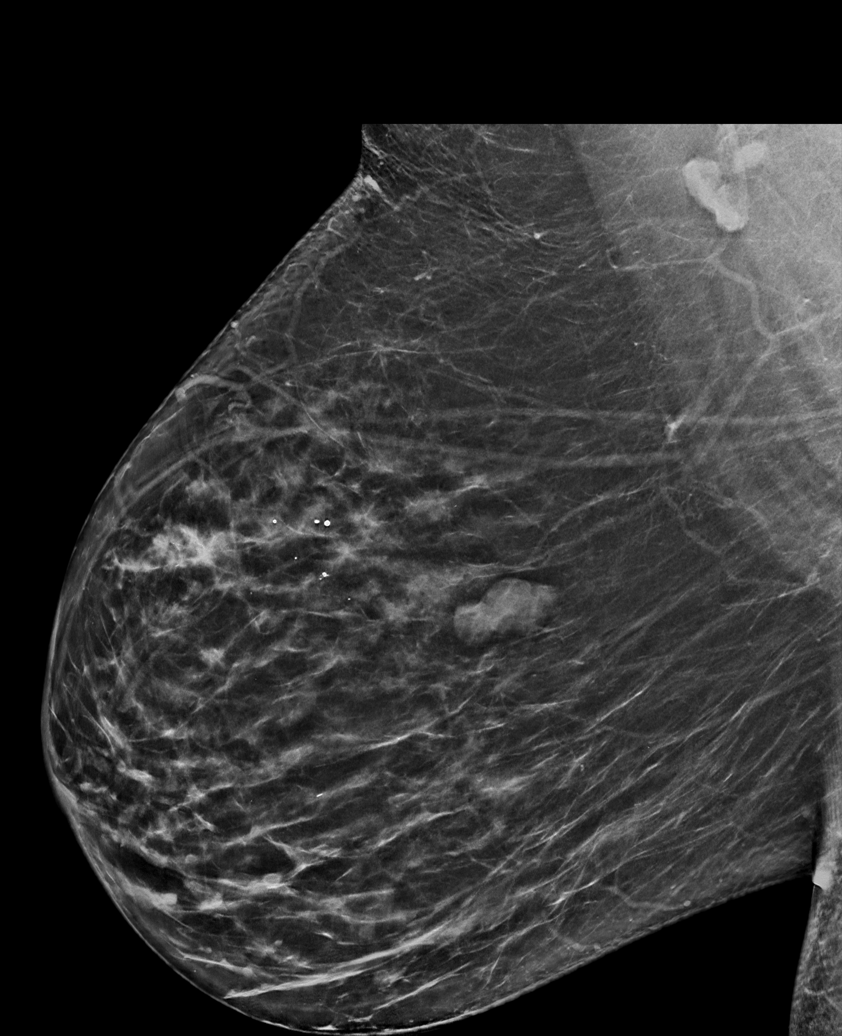

[L MLO synth-2D (1 of 2)]
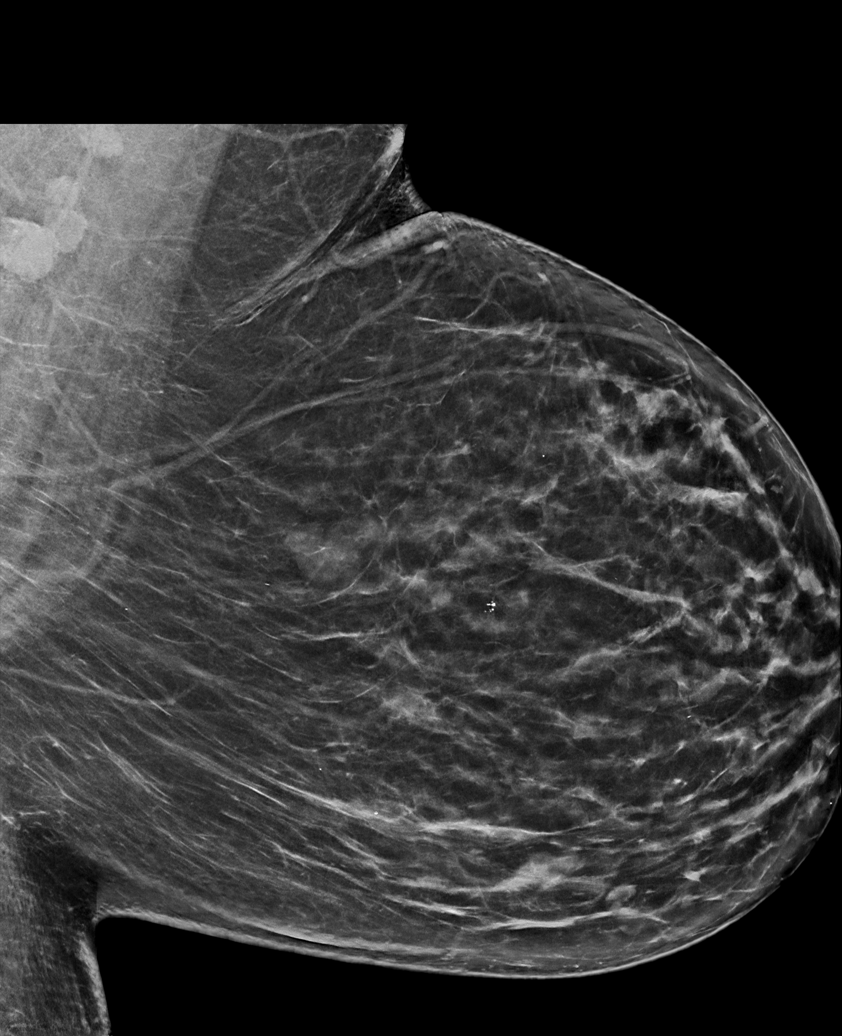

[R CC synth-2D]
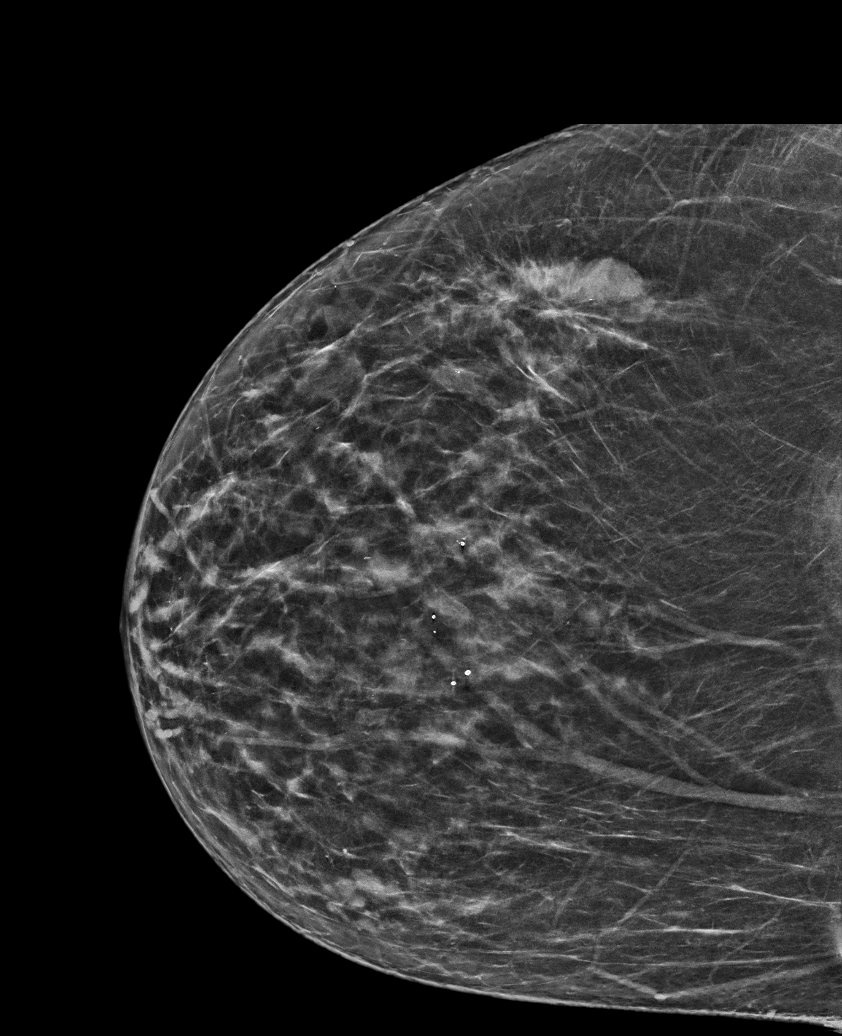

[L CC synth-2D (1 of 2)]
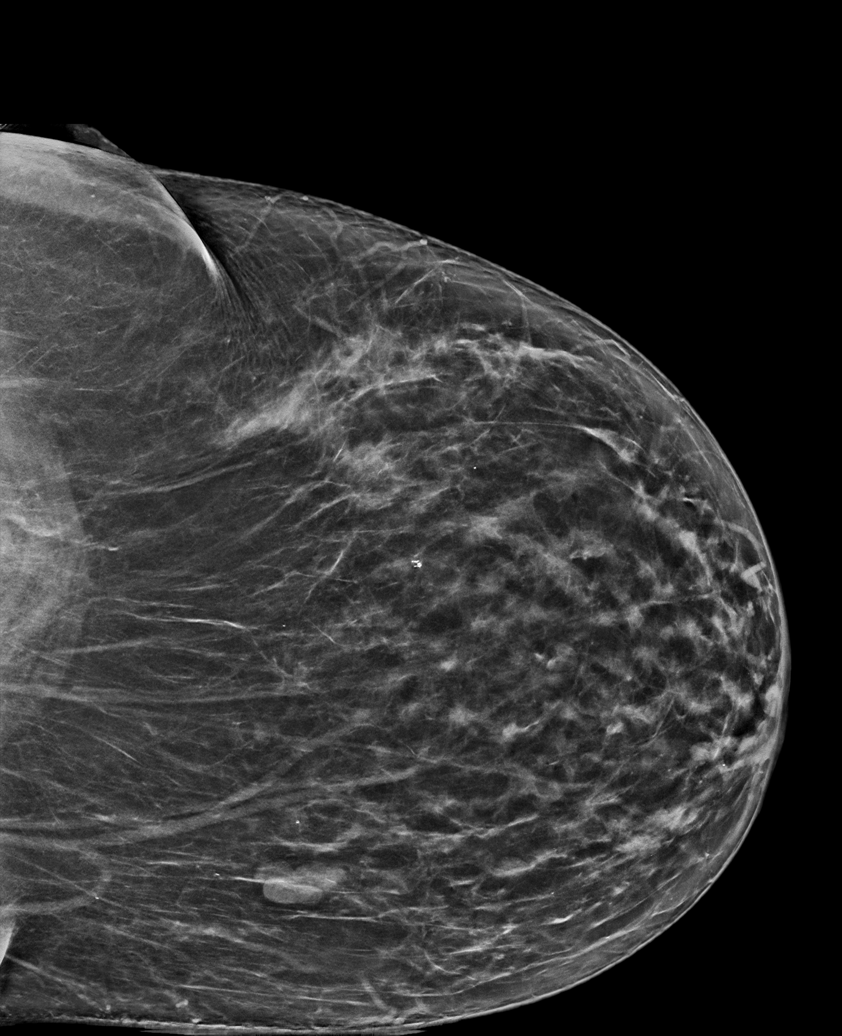

[L MLO synth-2D (2 of 2)]
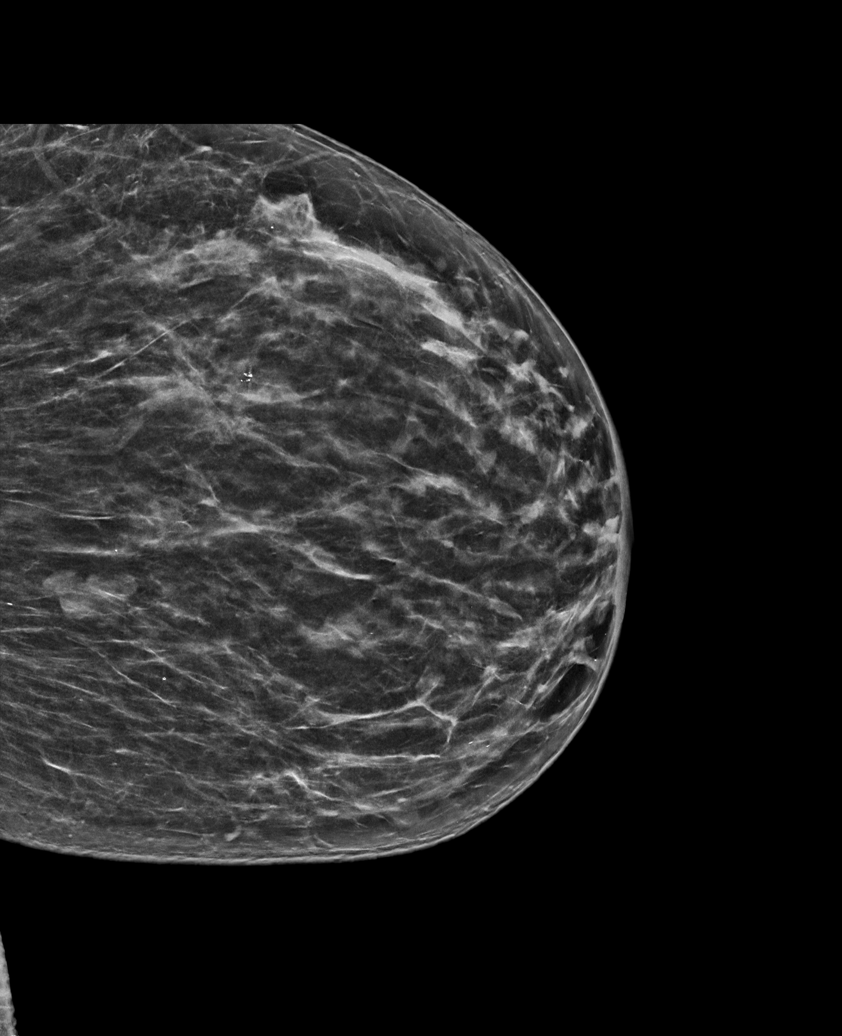

[L CC synth-2D (2 of 2)]
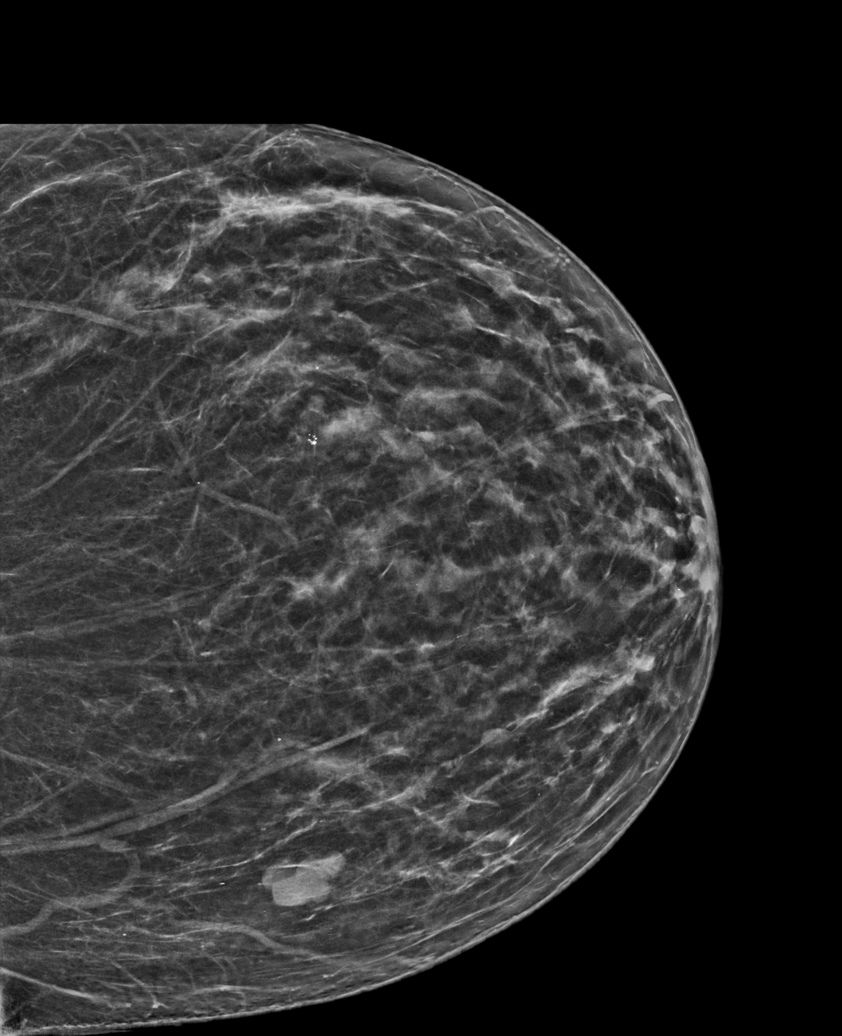

[6 of 36 positions shown; findings below may reference images not displayed]

ACR Breast Density Category b: There are scattered areas of
fibroglandular density.
FINDINGS: There are no findings suspicious for malignancy. Images were
processed with CAD.
IMPRESSION: No mammographic evidence of malignancy. A result letter of this
screening mammogram will be mailed directly to the patient.

RECOMMENDATION:
Screening mammogram in one year. (Code:[id number])

BI-RADS CATEGORY  1: Negative.
